# Patient Record
Sex: Female | Born: 2000 | Race: Black or African American | Hispanic: No | Marital: Single | Smoking: Never smoker
Health system: Southern US, Community
[De-identification: ages and names within clinical notes are randomized; demographics above are authoritative.]

## PROBLEM LIST (undated history)

## (undated) DIAGNOSIS — F419 Anxiety disorder, unspecified: Secondary | ICD-10-CM

## (undated) DIAGNOSIS — R7989 Other specified abnormal findings of blood chemistry: Secondary | ICD-10-CM

## (undated) DIAGNOSIS — Z8659 Personal history of other mental and behavioral disorders: Secondary | ICD-10-CM

## (undated) HISTORY — DX: Personal history of other mental and behavioral disorders: Z86.59

## (undated) HISTORY — DX: Other specified abnormal findings of blood chemistry: R79.89

## (undated) HISTORY — DX: Anxiety disorder, unspecified: F41.9

---
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
## 2019-01-01 ENCOUNTER — Other Ambulatory Visit: Payer: Self-pay

## 2019-01-01 ENCOUNTER — Ambulatory Visit: Payer: Medicaid Other | Admitting: "Endocrinology

## 2019-01-02 ENCOUNTER — Ambulatory Visit: Payer: Medicaid Other | Admitting: "Endocrinology

## 2019-02-04 ENCOUNTER — Ambulatory Visit (INDEPENDENT_AMBULATORY_CARE_PROVIDER_SITE_OTHER): Payer: Medicaid Other | Admitting: "Endocrinology

## 2019-02-04 ENCOUNTER — Other Ambulatory Visit: Payer: Self-pay

## 2019-02-04 ENCOUNTER — Encounter: Payer: Self-pay | Admitting: "Endocrinology

## 2019-02-04 VITALS — BP 136/88 | HR 109 | Ht 62.4 in | Wt 119.0 lb

## 2019-02-04 DIAGNOSIS — R7989 Other specified abnormal findings of blood chemistry: Secondary | ICD-10-CM | POA: Diagnosis not present

## 2019-02-04 DIAGNOSIS — E559 Vitamin D deficiency, unspecified: Secondary | ICD-10-CM | POA: Diagnosis not present

## 2019-02-04 NOTE — Progress Notes (Signed)
Endocrinology Consult Note                                            02/04/2019, 12:15 PM   Subjective:    Patient ID: Sandy Miller, female    DOB: Jul 19, 2001, PCP Fransisca Connors, MD   Past Medical History:  Diagnosis Date  . Anxiety   . Elevated TSH    Dr. [facility] Obtained Result of TSH 11 11/07/18 (results faxed to [facility])  . History of depression    History reviewed. No pertinent surgical history. Social History   Socioeconomic History  . Marital status: Single    Spouse name: Not on file  . Number of children: Not on file  . Years of education: Not on file  . Highest education level: Not on file  Occupational History  . Not on file  Social Needs  . Financial resource strain: Not on file  . Food insecurity:    Worry: Not on file    Inability: Not on file  . Transportation needs:    Medical: Not on file    Non-medical: Not on file  Tobacco Use  . Smoking status: Passive Smoke Exposure - Never Smoker  . Smokeless tobacco: Never Used  Substance and Sexual Activity  . Alcohol use: No  . Drug use: No  . Sexual activity: Never  Lifestyle  . Physical activity:    Days per week: Not on file    Minutes per session: Not on file  . Stress: Not on file  Relationships  . Social connections:    Talks on phone: Not on file    Gets together: Not on file    Attends religious service: Not on file    Active member of club or organization: Not on file    Attends meetings of clubs or organizations: Not on file    Relationship status: Not on file  Other Topics Concern  . Not on file  Social History Narrative   Lives with parents and younger brothers      No smokers      Graduated from high school    Family History  Problem Relation Age of Onset  . Other Father        Numbness of Unknown Etiology  . Mental illness Father   . Stomach cancer Maternal Grandfather        Died at 50  . Mental illness Mother        Bipolar  . Asthma  Brother   . Multiple sclerosis Paternal Aunt    Outpatient Encounter Medications as of 02/04/2019  Medication Sig  . D3-50 1.25 MG (50000 UT) capsule Take 50,000 Units by mouth once a week.  [name] 150-35 MCG/24HR transdermal patch APPLY 1 PATCH TO SKIN ONCE A WEEK AS DIRECTED  . [DISCONTINUED] fluconazole (DIFLUCAN) 150 MG tablet Take one tablet for one time (Patient not taking: Reported on 10/29/2017)  . [DISCONTINUED] naproxen (NAPROSYN) 500 MG tablet Take 1 tablet (500 mg total) by mouth 2 (two) times daily with a meal.   No facility-administered encounter medications on file as of 02/04/2019.    ALLERGIES: No Known Allergies  VACCINATION STATUS: Immunization History  Administered Date(s) Administered  . DTaP 04/26/2000, 07/03/2000, 10/02/2000, 05/21/2001, 01/19/2005  . HPV Quadrivalent 05/07/2013, 04/01/2014, 04/30/2015  . Hepatitis A 03/05/2006, 02/11/2008  .  Hepatitis B 11/13/1999, 10/02/2000, 02/25/2001  . HiB (PRP-OMP) 04/26/2000, 07/03/2000, 10/02/2000, 05/21/2001  . IPV 04/26/2000, 07/03/2000, 10/02/2000, 01/19/2005  . MMR 02/25/2001, 01/19/2005  . Meningococcal B Recombinant 05/02/2016, 09/13/2017  . Meningococcal Conjugate 04/01/2014, 05/02/2016  . Pneumococcal Conjugate-13 04/26/2000, 07/03/2000, 10/02/2000  . Tdap 04/24/2012  . Varicella 05/21/2001, 03/05/2006    HPI Sandy Miller is 18 y.o. female who presents today with a medical history as above. she is being seen in consultation for abnormal thyroid function test requested by Fransisca Connors, MD. -She denies any prior history of thyroid dysfunction.  She has distant cousins with various forms of thyroid dysfunction, however no family history of thyroid malignancy.  She was found to have TSH of 11.04 on November 15, 2018.  She denies weight gain, cold intolerance, -She denies palpitations, tremors, heat intolerance. -She denies dysphagia, shortness of breath, nor voice change.   Review of  Systems  Constitutional: no recent weight gain/loss, no fatigue, no subjective hyperthermia, no subjective hypothermia Eyes: no blurry vision, no xerophthalmia ENT: no sore throat, no nodules palpated in throat, no dysphagia/odynophagia, no hoarseness Cardiovascular: no Chest Pain, no Shortness of Breath, no palpitations, no leg swelling Respiratory: no cough, no shortness of breath Gastrointestinal: no Nausea/Vomiting/Diarhhea Musculoskeletal: no muscle/joint aches Skin: no rashes Neurological: no tremors, no numbness, no tingling, no dizziness Psychiatric: no depression, no anxiety  Objective:    BP 136/88   Pulse (!) 109   Ht 5' 2.4" (1.585 m)   Wt 119 lb (54 kg)   BMI 21.49 kg/m   Wt Readings from Last 3 Encounters:  02/04/19 119 lb (54 kg) (35 %, Z= -0.38)*  10/17/18 112 lb 6.4 oz (51 kg) (23 %, Z= -0.75)*  10/11/18 116 lb (52.6 kg) (30 %, Z= -0.52)*   * Growth percentiles are based on CDC (Girls, 2-20 Years) data.    Physical Exam  Constitutional:  + Appropriate weight for height, not in acute distress, normal state of mind Eyes: PERRLA, EOMI, no exophthalmos ENT: moist mucous membranes, no gross thyromegaly, no gross cervical lymphadenopathy Cardiovascular: normal precordial activity, Regular Rate and Rhythm, no Murmur/Rubs/Gallops Respiratory:  adequate breathing efforts, no gross chest deformity, Clear to auscultation bilaterally Gastrointestinal: abdomen soft, Non -tender, No distension, Bowel Sounds present, no gross organomegaly Musculoskeletal: no gross deformities, strength intact in all four extremities Skin: moist, warm, no rashes Neurological: + Mild tremor with outstretched hands, Deep tendon reflexes normal in bilateral lower extremities.  CMP ( most recent) CMP     Component Value Date/Time   NA 135 10/10/2013 1239   K 4.1 10/10/2013 1239   CL 99 10/10/2013 1239   CO2 25 10/10/2013 1239   GLUCOSE 78 10/10/2013 1239   BUN 9 10/10/2013 1239    CREATININE 0.87 10/10/2013 1239   CALCIUM 9.8 10/10/2013 1239     Diabetic Labs (most recent): Lab Results  Component Value Date   HGBA1C 5.3 11/07/2018   HGBA1C 5.7 (H) 10/10/2013     Lab Results  Component Value Date   TSH 11.04 (A) 11/07/2018      Assessment & Plan:   1. Abnormal thyroid blood test 2.  Vitamin D deficiency - Quadasia T Badeaux  is being seen at a kind request of Fransisca Connors, MD. - I have reviewed her available endocrine records and clinically evaluated the patient. - Based on these reviews, she has elevated TSH of 11.04 which may indicate hypothyroidism,  however,  there is not sufficient information to proceed  with definitive treatment plan.  - she will need a repeat,  more complete thyroid function tests towards confirming the diagnosis, including TSH, free T4/free T3, and antithyroid antibodies. -She does not have clinical goiter, hence no need for thyroid imaging.  Her A1c is 5.3% indicating absence of prediabetes/diabetes.  - I did not initiate any new prescriptions today.  -She is advised to continue on her vitamin D 50,000 units weekly for total of 12 weeks. - I advised her  to maintain close follow up with Fransisca Connors, MD for primary care needs.   - Time spent with the patient: 45 minutes, of which >50% was spent in obtaining information about her symptoms, reviewing her previous labs/studies,  evaluations, and treatments, counseling her about her abnormal thyroid function tests, and developing a plan to confirm the diagnosis and long term treatment based on the latest standards of care/guidelines.    Mixtli T Lempke participated in the discussions, expressed understanding, and voiced agreement with the above plans.  All questions were answered to her satisfaction. she is encouraged to contact clinic should she have any questions or concerns prior to her return visit.  Follow up plan: Return in about 1 week (around 02/11/2019)  for Labs Today- Non-Fasting Ok.   Glade Lloyd, MD [facility] [address] [place], [place] [address] Phone: [phone]  Fax: [phone]     02/04/2019, 12:15 PM  This note was partially dictated with voice recognition software. Similar sounding words can be transcribed inadequately or may not  be corrected upon review.

## 2019-02-05 LAB — THYROID PEROXIDASE ANTIBODY: Thyroperoxidase Ab SerPl-aCnc: <1 IU/mL (ref <9)

## 2019-02-05 LAB — T3, FREE: T3, Free: 2.9 pg/mL — ABNORMAL LOW (ref 3.0–4.7)

## 2019-02-05 LAB — THYROGLOBULIN ANTIBODY: Thyroglobulin Ab: <1 IU/mL (ref <=1)

## 2019-02-05 LAB — TSH: TSH: 4.72 mIU/L — ABNORMAL HIGH

## 2019-02-05 LAB — T4, FREE: Free T4: 1.1 ng/dL (ref 0.8–1.4)

## 2019-02-11 ENCOUNTER — Encounter: Payer: Self-pay | Admitting: "Endocrinology

## 2019-02-11 ENCOUNTER — Ambulatory Visit (INDEPENDENT_AMBULATORY_CARE_PROVIDER_SITE_OTHER): Payer: Medicaid Other | Admitting: "Endocrinology

## 2019-02-11 ENCOUNTER — Other Ambulatory Visit: Payer: Self-pay

## 2019-02-11 VITALS — BP 147/73 | HR 79 | Ht 62.4 in | Wt 117.0 lb

## 2019-02-11 DIAGNOSIS — E039 Hypothyroidism, unspecified: Secondary | ICD-10-CM

## 2019-02-11 MED ORDER — LEVOTHYROXINE SODIUM 25 MCG PO TABS: 25.0000 ug | ORAL_TABLET | Freq: Every day | ORAL | 3 refills | Status: DC

## 2019-02-11 NOTE — Progress Notes (Signed)
Endocrinology follow-up note                                            02/11/2019, 1:43 PM   Subjective:    Patient ID: Sandy Miller, female    DOB: 2001-01-14, PCP Fransisca Connors, MD   Past Medical History:  Diagnosis Date  . Anxiety   . Elevated TSH    [facility] Obtained Result of TSH 11 11/07/18 (results faxed to [facility])  . History of depression    History reviewed. No pertinent surgical history. Social History   Socioeconomic History  . Marital status: Single    Spouse name: Not on file  . Number of children: Not on file  . Years of education: Not on file  . Highest education level: Not on file  Occupational History  . Not on file  Social Needs  . Financial resource strain: Not on file  . Food insecurity:    Worry: Not on file    Inability: Not on file  . Transportation needs:    Medical: Not on file    Non-medical: Not on file  Tobacco Use  . Smoking status: Passive Smoke Exposure - Never Smoker  . Smokeless tobacco: Never Used  Substance and Sexual Activity  . Alcohol use: No  . Drug use: No  . Sexual activity: Never  Lifestyle  . Physical activity:    Days per week: Not on file    Minutes per session: Not on file  . Stress: Not on file  Relationships  . Social connections:    Talks on phone: Not on file    Gets together: Not on file    Attends religious service: Not on file    Active member of club or organization: Not on file    Attends meetings of clubs or organizations: Not on file    Relationship status: Not on file  Other Topics Concern  . Not on file  Social History Narrative   Lives with parents and younger brothers      No smokers      Graduated from high school    Family History  Problem Relation Age of Onset  . Other Father        Numbness of Unknown Etiology  . Mental illness Father   . Stomach cancer Maternal Grandfather        Died at 9  . Mental illness Mother        Bipolar  . Asthma  Brother   . Multiple sclerosis Paternal Aunt    Outpatient Encounter Medications as of 02/11/2019  Medication Sig  . D3-50 1.25 MG (50000 UT) capsule Take 50,000 Units by mouth once a week.  . levothyroxine (SYNTHROID) 25 MCG tablet Take 1 tablet (25 mcg total) by mouth daily before breakfast.  . [name] 150-35 MCG/24HR transdermal patch APPLY 1 PATCH TO SKIN ONCE A WEEK AS DIRECTED   No facility-administered encounter medications on file as of 02/11/2019.    ALLERGIES: No Known Allergies  VACCINATION STATUS: Immunization History  Administered Date(s) Administered  . DTaP 04/26/2000, 07/03/2000, 10/02/2000, 05/21/2001, 01/19/2005  . HPV Quadrivalent 05/07/2013, 04/01/2014, 04/30/2015  . Hepatitis A 03/05/2006, 02/11/2008  . Hepatitis B Jul 02, 2000, 10/02/2000, 02/25/2001  . HiB (PRP-OMP) 04/26/2000, 07/03/2000, 10/02/2000, 05/21/2001  . IPV 04/26/2000, 07/03/2000, 10/02/2000, 01/19/2005  . MMR  02/25/2001, 01/19/2005  . Meningococcal B Recombinant 05/02/2016, 09/13/2017  . Meningococcal Conjugate 04/01/2014, 05/02/2016  . Pneumococcal Conjugate-13 04/26/2000, 07/03/2000, 10/02/2000  . Tdap 04/24/2012  . Varicella 05/21/2001, 03/05/2006    HPI Sandy Miller is 18 y.o. female who presents today with repeat thyroid function test after she was seen in consultation for abnormally high TSH.  Her previsit labs confirm hypothyroidism.  She is not on any thyroid hormone replacement at this time.  She does not have any new complaints today.  -She denies any prior history of thyroid dysfunction.  She has distant cousins with various forms of thyroid dysfunction, however no family history of thyroid malignancy.     She denies weight gain, cold intolerance, she reports poor appetite and fatigue. -She denies palpitations, tremors, heat intolerance. -She denies dysphagia, shortness of breath, nor voice change.   Review of Systems  Constitutional: + Steady weight, + fatigue,  no subjective  hyperthermia, no subjective hypothermia Eyes: no blurry vision, no xerophthalmia ENT: no sore throat, no nodules palpated in throat, no dysphagia/odynophagia, no hoarseness Cardiovascular: no Chest Pain, no Shortness of Breath, no palpitations, no leg swelling Respiratory: no cough, no shortness of breath Gastrointestinal: no Nausea/Vomiting/Diarhhea Musculoskeletal: no muscle/joint aches Skin: no rashes Neurological: no tremors, no numbness, no tingling, no dizziness Psychiatric: no depression, no anxiety  Objective:    BP (!) 147/73   Pulse 79   Ht 5' 2.4" (1.585 m)   Wt 117 lb (53.1 kg)   BMI 21.13 kg/m   Wt Readings from Last 3 Encounters:  02/11/19 117 lb (53.1 kg) (31 %, Z= -0.50)*  02/04/19 119 lb (54 kg) (35 %, Z= -0.38)*  10/17/18 112 lb 6.4 oz (51 kg) (23 %, Z= -0.75)*   * Growth percentiles are based on CDC (Girls, 2-20 Years) data.    Physical Exam Constitutional:  not in acute distress, normal state of mind Eyes:  EOMI, no exophthalmos Neck: Supple Respiratory: Adequate breathing efforts Musculoskeletal: no gross deformities, strength intact in all four extremities Skin:  no rashes, no hyperemia Neurological: no tremor with outstretched hands.   CMP ( most recent) CMP     Component Value Date/Time   NA 135 10/10/2013 1239   K 4.1 10/10/2013 1239   CL 99 10/10/2013 1239   CO2 25 10/10/2013 1239   GLUCOSE 78 10/10/2013 1239   BUN 9 10/10/2013 1239   CREATININE 0.87 10/10/2013 1239   CALCIUM 9.8 10/10/2013 1239     Diabetic Labs (most recent): Lab Results  Component Value Date   HGBA1C 5.3 11/07/2018   HGBA1C 5.7 (H) 10/10/2013     Lab Results  Component Value Date   TSH 4.72 (H) 02/04/2019   TSH 11.04 (A) 11/07/2018   FREET4 1.1 02/04/2019      Assessment & Plan:   1. Abnormal thyroid blood test 2.  Vitamin D deficiency -Her previsit labs confirm primary hypothyroidism.  She would benefit from early initiation of thyroid hormone  supplement.  I discussed and initiated levothyroxine 25 mcg p.o. every morning.   - We discussed about the correct intake of her thyroid hormone, on empty stomach at fasting, with water, separated by at least 30 minutes from breakfast and other medications,  and separated by more than 4 hours from calcium, iron, multivitamins, acid reflux medications (PPIs). -Patient is made aware of the fact that thyroid hormone replacement is needed for life, dose to be adjusted by periodic monitoring of thyroid function tests.   -She does  not have clinical goiter, hence no need for thyroid imaging.  Her A1c is 5.3% indicating absence of prediabetes/diabetes.   -She is advised to continue on her vitamin D 50,000 units weekly for total of 12 weeks. - I advised her  to maintain close follow up with Fransisca Connors, MD for primary care needs.   Follow up plan: Return in about 3 months (around 05/14/2019) for Follow up with Pre-visit Labs.   Glade Lloyd, MD [facility] [address] [place], [place] [address] Phone: [phone]  Fax: [phone]     02/11/2019, 1:43 PM  This note was partially dictated with voice recognition software. Similar sounding words can be transcribed inadequately or may not  be corrected upon review.

## 2019-02-27 ENCOUNTER — Other Ambulatory Visit: Payer: Self-pay | Admitting: Pediatrics

## 2019-04-07 DIAGNOSIS — Z3045 Encounter for surveillance of transdermal patch hormonal contraceptive device: Secondary | ICD-10-CM | POA: Insufficient documentation

## 2019-05-08 LAB — TSH: TSH: 4.58 mIU/L — ABNORMAL HIGH

## 2019-05-08 LAB — T4, FREE: Free T4: 1.3 ng/dL (ref 0.8–1.4)

## 2019-05-14 ENCOUNTER — Ambulatory Visit (INDEPENDENT_AMBULATORY_CARE_PROVIDER_SITE_OTHER): Payer: BLUE CROSS/BLUE SHIELD | Admitting: "Endocrinology

## 2019-05-14 ENCOUNTER — Other Ambulatory Visit: Payer: Self-pay

## 2019-05-14 ENCOUNTER — Encounter: Payer: Self-pay | Admitting: "Endocrinology

## 2019-05-14 DIAGNOSIS — E039 Hypothyroidism, unspecified: Secondary | ICD-10-CM | POA: Diagnosis not present

## 2019-05-14 MED ORDER — LEVOTHYROXINE SODIUM 25 MCG PO TABS: 37.5000 ug | ORAL_TABLET | Freq: Every day | ORAL | 3 refills | Status: DC

## 2019-05-14 NOTE — Progress Notes (Signed)
05/14/2019, 1:00 PM                                Endocrinology Telehealth Visit Follow up Note -During COVID -19 Pandemic  I connected with Carroll T Suto on 05/14/2019   by telephone and verified that I am speaking with the correct person using two identifiers. Sandy Miller, December 30, 2000. she has verbally consented to this visit. All issues noted in this document were discussed and addressed. The format was not optimal for physical exam.   Subjective:    Patient ID: Sandy Miller, female    DOB: [birth year], PCP Fransisca Connors, MD   Past Medical History:  Diagnosis Date  . Anxiety   . Elevated TSH    [facility] Obtained Result of TSH 11 11/07/18 (results faxed to [facility])  . History of depression    History reviewed. No pertinent surgical history. Social History   Socioeconomic History  . Marital status: Single    Spouse name: Not on file  . Number of children: Not on file  . Years of education: Not on file  . Highest education level: Not on file  Occupational History  . Not on file  Social Needs  . Financial resource strain: Not on file  . Food insecurity    Worry: Not on file    Inability: Not on file  . Transportation needs    Medical: Not on file    Non-medical: Not on file  Tobacco Use  . Smoking status: Passive Smoke Exposure - Never Smoker  . Smokeless tobacco: Never Used  Substance and Sexual Activity  . Alcohol use: No  . Drug use: No  . Sexual activity: Never  Lifestyle  . Physical activity    Days per week: Not on file    Minutes per session: Not on file  . Stress: Not on file  Relationships  . Social Herbalist on phone: Not on file    Gets together: Not on file    Attends religious service: Not on file    Active member of club or organization: Not on file    Attends meetings of clubs or organizations: Not on file    Relationship status: Not on file   Other Topics Concern  . Not on file  Social History Narrative   Lives with parents and younger brothers      No smokers      Graduated from high school    Family History  Problem Relation Age of Onset  . Other Father        Numbness of Unknown Etiology  . Mental illness Father   . Stomach cancer Maternal Grandfather        Died at 8  . Mental illness Mother        Bipolar  . Asthma Brother   . Multiple sclerosis Paternal Aunt    Outpatient Encounter Medications as of 05/14/2019  Medication Sig  . D3-50 1.25 MG (50000 UT) capsule Take 50,000 Units by mouth once a week.  . levothyroxine (SYNTHROID) 25 MCG tablet Take 1.5 tablets (37.5 mcg total) by mouth  daily before breakfast.  . [name] 150-35 MCG/24HR transdermal patch APPLY 1 PATCH TO SKIN ONCE A WEEK AS DIRECTED  . [DISCONTINUED] levothyroxine (SYNTHROID) 25 MCG tablet Take 1 tablet (25 mcg total) by mouth daily before breakfast.   No facility-administered encounter medications on file as of 05/14/2019.    ALLERGIES: No Known Allergies  VACCINATION STATUS: Immunization History  Administered Date(s) Administered  . DTaP 04/26/2000, 07/03/2000, 10/02/2000, 05/21/2001, 01/19/2005  . HPV Quadrivalent 05/07/2013, 04/01/2014, 04/30/2015  . Hepatitis A 03/05/2006, 02/11/2008  . Hepatitis B 03-01-2000, 10/02/2000, 02/25/2001  . HiB (PRP-OMP) 04/26/2000, 07/03/2000, 10/02/2000, 05/21/2001  . IPV 04/26/2000, 07/03/2000, 10/02/2000, 01/19/2005  . MMR 02/25/2001, 01/19/2005  . Meningococcal B Recombinant 05/02/2016, 09/13/2017  . Meningococcal Conjugate 04/01/2014, 05/02/2016  . Pneumococcal Conjugate-13 04/26/2000, 07/03/2000, 10/02/2000  . Tdap 04/24/2012  . Varicella 05/21/2001, 03/05/2006    HPI Sandy Miller is 18 y.o. female who is being engaged in telehealth via telephone for follow-up of hypothyroidism.  She was initiated on levothyroxine 25 mcg p.o. daily in the morning during her last visit.   -She  continues to feel better including with better energy.  However, she admits to some inconsistency in taking her thyroid hormone. She does not have any new complaints today.  - She has distant cousins with various forms of thyroid dysfunction, however no family history of thyroid malignancy.   -She denies palpitations, tremors, heat intolerance. -She denies dysphagia, shortness of breath, nor voice change.   Review of Systems Limited as above.  Objective:    There were no vitals taken for this visit.  Wt Readings from Last 3 Encounters:  02/11/19 117 lb (53.1 kg) (31 %, Z= -0.50)*  02/04/19 119 lb (54 kg) (35 %, Z= -0.38)*  10/17/18 112 lb 6.4 oz (51 kg) (23 %, Z= -0.75)*   * Growth percentiles are based on CDC (Girls, 2-20 Years) data.    CMP ( most recent) CMP     Component Value Date/Time   NA 135 10/10/2013 1239   K 4.1 10/10/2013 1239   CL 99 10/10/2013 1239   CO2 25 10/10/2013 1239   GLUCOSE 78 10/10/2013 1239   BUN 9 10/10/2013 1239   CREATININE 0.87 10/10/2013 1239   CALCIUM 9.8 10/10/2013 1239     Diabetic Labs (most recent): Lab Results  Component Value Date   HGBA1C 5.3 11/07/2018   HGBA1C 5.7 (H) 10/10/2013     Lab Results  Component Value Date   TSH 4.58 (H) 05/08/2019   TSH 4.72 (H) 02/04/2019   TSH 11.04 (A) 11/07/2018   FREET4 1.3 05/08/2019   FREET4 1.1 02/04/2019      Assessment & Plan:   1.  Hypothyroidism. 2.  Vitamin D deficiency -Based on her previsit thyroid function test, she would benefit from slight increase in her levothyroxine.  I discussed and increased her levothyroxine to 37.5 mcg p.o. daily before breakfast.  - We discussed about the correct intake of her thyroid hormone, on empty stomach at fasting, with water, separated by at least 30 minutes from breakfast and other medications,  and separated by more than 4 hours from calcium, iron, multivitamins, acid reflux medications (PPIs). -Patient is made aware of the fact that thyroid  hormone replacement is needed for life, dose to be adjusted by periodic monitoring of thyroid function tests.  She is status post vitamin D supplement.  She will have repeat vitamin D measurement   Before her next visit.  - I advised her  to  maintain close follow up with Fransisca Connors, MD for primary care needs.   Time for this visit: 15 minutes. Everlie T Marcella  participated in the discussions, expressed understanding, and voiced agreement with the above plans.  All questions were answered to her satisfaction. she is encouraged to contact clinic should she have any questions or concerns prior to her return visit.  Follow up plan: Return in about 4 months (around 09/13/2019) for Follow up with Pre-visit Labs.   Glade Lloyd, MD [facility] [address] [place], [place] [address] Phone: [phone]  Fax: [phone]     05/14/2019, 1:00 PM  This note was partially dictated with voice recognition software. Similar sounding words can be transcribed inadequately or may not  be corrected upon review.

## 2019-09-02 LAB — VITAMIN D 25 HYDROXY (VIT D DEFICIENCY, FRACTURES): Vit D, 25-Hydroxy: 9 ng/mL — ABNORMAL LOW (ref 30–100)

## 2019-09-02 LAB — TSH: TSH: 3.69 mIU/L

## 2019-09-02 LAB — T4, FREE: Free T4: 1.2 ng/dL (ref 0.8–1.4)

## 2019-09-16 ENCOUNTER — Encounter: Payer: Self-pay | Admitting: "Endocrinology

## 2019-09-16 ENCOUNTER — Ambulatory Visit (INDEPENDENT_AMBULATORY_CARE_PROVIDER_SITE_OTHER): Payer: BLUE CROSS/BLUE SHIELD | Admitting: "Endocrinology

## 2019-09-16 DIAGNOSIS — E039 Hypothyroidism, unspecified: Secondary | ICD-10-CM

## 2019-09-16 DIAGNOSIS — E559 Vitamin D deficiency, unspecified: Secondary | ICD-10-CM

## 2019-09-16 MED ORDER — LEVOTHYROXINE SODIUM 50 MCG PO TABS: 50.0000 ug | ORAL_TABLET | Freq: Every day | ORAL | 6 refills | Status: DC

## 2019-09-16 MED ORDER — D3-50 1.25 MG (50000 UT) PO CAPS: 50000.0000 [IU] | ORAL_CAPSULE | ORAL | 1 refills | Status: DC

## 2019-09-16 NOTE — Progress Notes (Signed)
09/16/2019, 1:04 PM                                Endocrinology Telehealth Visit Follow up Note -During COVID -19 Pandemic  I connected with Sandy Miller on 09/16/2019   by telephone and verified that I am speaking with the correct person using two identifiers. Sandy Miller, [birth year]. she has verbally consented to this visit. All issues noted in this document were discussed and addressed. The format was not optimal for physical exam.   Subjective:    Patient ID: Sandy Miller, female    DOB: 2001-04-24, PCP Fransisca Connors, MD   Past Medical History:  Diagnosis Date  . Anxiety   . Elevated TSH    [facility] Obtained Result of TSH 11 11/07/18 (results faxed to [facility])  . History of depression    History reviewed. No pertinent surgical history. Social History   Socioeconomic History  . Marital status: Single    Spouse name: Not on file  . Number of children: Not on file  . Years of education: Not on file  . Highest education level: Not on file  Occupational History  . Not on file  Tobacco Use  . Smoking status: Passive Smoke Exposure - Never Smoker  . Smokeless tobacco: Never Used  Substance and Sexual Activity  . Alcohol use: No  . Drug use: No  . Sexual activity: Never  Other Topics Concern  . Not on file  Social History Narrative   Lives with parents and younger brothers      No smokers      Graduated from high school    Social Determinants of Health   Financial Resource Strain:   . Difficulty of Paying Living Expenses: Not on file  Food Insecurity:   . Worried About Charity fundraiser in the Last Year: Not on file  . Ran Out of Food in the Last Year: Not on file  Transportation Needs:   . Lack of Transportation (Medical): Not on file  . Lack of Transportation (Non-Medical): Not on file  Physical Activity:   . Days of Exercise per Week: Not on file  . Minutes of  Exercise per Session: Not on file  Stress:   . Feeling of Stress : Not on file  Social Connections:   . Frequency of Communication with Friends and Family: Not on file  . Frequency of Social Gatherings with Friends and Family: Not on file  . Attends Religious Services: Not on file  . Active Member of Clubs or Organizations: Not on file  . Attends Archivist Meetings: Not on file  . Marital Status: Not on file   Family History  Problem Relation Age of Onset  . Other Father        Numbness of Unknown Etiology  . Mental illness Father   . Stomach cancer Maternal Grandfather        Died at 5  . Mental illness Mother        Bipolar  . Asthma Brother   . Multiple sclerosis Paternal Aunt    Outpatient Encounter Medications as  of 09/16/2019  Medication Sig  . D3-50 1.25 MG (50000 UT) capsule Take 1 capsule (50,000 Units total) by mouth once a week.  . levothyroxine (SYNTHROID) 50 MCG tablet Take 1 tablet (50 mcg total) by mouth daily before breakfast.  . [name] 150-35 MCG/24HR transdermal patch APPLY 1 PATCH TO SKIN ONCE A WEEK AS DIRECTED  . [DISCONTINUED] D3-50 1.25 MG (50000 UT) capsule Take 50,000 Units by mouth once a week.  . [DISCONTINUED] levothyroxine (SYNTHROID) 25 MCG tablet Take 1.5 tablets (37.5 mcg total) by mouth daily before breakfast.   No facility-administered encounter medications on file as of 09/16/2019.   ALLERGIES: No Known Allergies  VACCINATION STATUS: Immunization History  Administered Date(s) Administered  . DTaP 04/26/2000, 07/03/2000, 10/02/2000, 05/21/2001, 01/19/2005  . HPV Quadrivalent 05/07/2013, 04/01/2014, 04/30/2015  . Hepatitis A 03/05/2006, 02/11/2008  . Hepatitis B March 21, 2000, 10/02/2000, 02/25/2001  . HiB (PRP-OMP) 04/26/2000, 07/03/2000, 10/02/2000, 05/21/2001  . IPV 04/26/2000, 07/03/2000, 10/02/2000, 01/19/2005  . MMR 02/25/2001, 01/19/2005  . Meningococcal B Recombinant 05/02/2016, 09/13/2017  . Meningococcal Conjugate  04/01/2014, 05/02/2016  . Pneumococcal Conjugate-13 04/26/2000, 07/03/2000, 10/02/2000  . Tdap 04/24/2012  . Varicella 05/21/2001, 03/05/2006    HPI Sandy Miller is 18 y.o. female who is being engaged in telehealth via telephone for follow-up of hypothyroidism.  She was initiated on levothyroxine 37.5  mcg p.o. daily in the morning during her last visit.   -She continues to feel better including with better energy. She does not have any new complaints today.  -For unclear reasons, she stopped taking her vitamin D.  Her previsit labs show profound vitamin D deficiency - She has distant cousins with various forms of thyroid dysfunction, however no family history of thyroid malignancy.   -She denies palpitations, tremors, heat intolerance. -She denies dysphagia, shortness of breath, nor voice change.   Review of Systems Limited as above.  Objective:    There were no vitals taken for this visit.  Wt Readings from Last 3 Encounters:  02/11/19 117 lb (53.1 kg) (31 %, Z= -0.50)*  02/04/19 119 lb (54 kg) (35 %, Z= -0.38)*  10/17/18 112 lb 6.4 oz (51 kg) (23 %, Z= -0.75)*   * Growth percentiles are based on CDC (Girls, 2-20 Years) data.    CMP ( most recent) CMP     Component Value Date/Time   NA 135 10/10/2013 1239   K 4.1 10/10/2013 1239   CL 99 10/10/2013 1239   CO2 25 10/10/2013 1239   GLUCOSE 78 10/10/2013 1239   BUN 9 10/10/2013 1239   CREATININE 0.87 10/10/2013 1239   CALCIUM 9.8 10/10/2013 1239     Diabetic Labs (most recent): Lab Results  Component Value Date   HGBA1C 5.3 11/07/2018   HGBA1C 5.7 (H) 10/10/2013     Lab Results  Component Value Date   TSH 3.69 09/02/2019   TSH 4.58 (H) 05/08/2019   TSH 4.72 (H) 02/04/2019   TSH 11.04 (A) 11/07/2018   FREET4 1.2 09/02/2019   FREET4 1.3 05/08/2019   FREET4 1.1 02/04/2019      Assessment & Plan:   1.  Hypothyroidism. 2.  Vitamin D deficiency -Based on her previsit thyroid function test, she will  benefit from slight increase in her levothyroxine.  I discussed and increase her levothyroxine to 50 mcg p.o. daily before breakfast.      - We discussed about the correct intake of her thyroid hormone, on empty stomach at fasting, with water, separated by at least 30 minutes  from breakfast and other medications,  and separated by more than 4 hours from calcium, iron, multivitamins, acid reflux medications (PPIs). -Patient is made aware of the fact that thyroid hormone replacement is needed for life, dose to be adjusted by periodic monitoring of thyroid function tests.   She is advised to resume her vitamin D supplement with Elocon for 50,000 units weekly x24 weeks.     - I advised her  to maintain close follow up with Fransisca Connors, MD for primary care needs.   Time for this visit: 15 minutes. Sandy Miller  participated in the discussions, expressed understanding, and voiced agreement with the above plans.  All questions were answered to her satisfaction. she is encouraged to contact clinic should she have any questions or concerns prior to her return visit.   Follow up plan: Return in about 4 months (around 01/15/2020) for Follow up with Pre-visit Labs.   Glade Lloyd, MD [facility] [address] [place], [place] [address] Phone: [phone]  Fax: [phone]     09/16/2019, 1:04 PM  This note was partially dictated with voice recognition software. Similar sounding words can be transcribed inadequately or may not  be corrected upon review.

## 2019-10-20 ENCOUNTER — Other Ambulatory Visit: Payer: Self-pay | Admitting: "Endocrinology

## 2019-10-21 ENCOUNTER — Ambulatory Visit (INDEPENDENT_AMBULATORY_CARE_PROVIDER_SITE_OTHER): Payer: Self-pay | Admitting: Licensed Clinical Social Worker

## 2019-10-21 ENCOUNTER — Ambulatory Visit (INDEPENDENT_AMBULATORY_CARE_PROVIDER_SITE_OTHER): Payer: Medicaid Other | Admitting: Pediatrics

## 2019-10-21 ENCOUNTER — Encounter: Payer: Self-pay | Admitting: Pediatrics

## 2019-10-21 ENCOUNTER — Other Ambulatory Visit: Payer: Self-pay

## 2019-10-21 VITALS — BP 116/80 | Ht 62.0 in | Wt 119.6 lb

## 2019-10-21 DIAGNOSIS — Z0000 Encounter for general adult medical examination without abnormal findings: Secondary | ICD-10-CM

## 2019-10-21 DIAGNOSIS — Z00129 Encounter for routine child health examination without abnormal findings: Secondary | ICD-10-CM

## 2019-10-21 DIAGNOSIS — Z68.41 Body mass index (BMI) pediatric, 85th percentile to less than 95th percentile for age: Secondary | ICD-10-CM | POA: Diagnosis not present

## 2019-10-21 NOTE — Progress Notes (Signed)
Adolescent Well Care Visit Sandy Miller is a 19 y.o. female who is here for well care.    PCP:  Rosiland Oz, MD   History was provided by the patient.  Confidentiality was discussed with the patient and, if applicable, with caregiver as well.  Current Issues: Current concerns include: none, receiving care for hypothyroidism with Endocrinology. Patient states that she is taking her Synthroid as prescribed, but, has restarted her Vit D supplement.   Nutrition: Nutrition/Eating Behaviors:  Tries to eat variety of fruits and veggies  Adequate calcium in diet?:  No , does not drink milk  Supplements/ Vitamins:  Vit D   Exercise/ Media: Play any Sports?/ Exercise: no  Screen Time:  > 2 hours-counseling provided  Social Screening: Lives with:  Mother  Parental relations:  good Activities, Work, and Regulatory affairs officer?: no Concerns regarding behavior with peers?  no Stressors of note: no  Menstruation:   No LMP recorded. Menstrual History:  Monthly, no problems with heavy bleeding or cramping    Confidential Social History: Tobacco?  no Secondhand smoke exposure?  no Drugs/ETOH?  no  Sexually Active?  no   Pregnancy Prevention: abstinence, Xulane patch   Safe at home, in school & in relationships?  Yes Safe to self?  Yes   Screenings: Patient has a dental home: yes  PHQ-9 completed and results indicated 0  Physical Exam:  Vitals:   10/21/19 1012 10/21/19 1140  BP: 132/80 116/80  Weight: 119 lb 9.6 oz (54.3 kg)   Height: 5\' 2"  (1.575 m)    BP 116/80   Ht 5\' 2"  (1.575 m)   Wt 119 lb 9.6 oz (54.3 kg)   BMI 21.88 kg/m  Body mass index: body mass index is 21.88 kg/m. Blood pressure percentiles are not available for patients who are 18 years or older.   Hearing Screening   125Hz  250Hz  500Hz  1000Hz  2000Hz  3000Hz  4000Hz  6000Hz  8000Hz   Right ear:   30 20 20 20 20     Left ear:   25 20 20 20 20       Visual Acuity Screening   Right eye Left eye Both eyes   Without correction: 20/30 20/25   With correction:       General Appearance:   alert, oriented, no acute distress  HENT: Normocephalic, no obvious abnormality, conjunctiva clear  Mouth:   Normal appearing teeth, no obvious discoloration, dental caries, or dental caps  Neck:   Supple; thyroid: no enlargement, symmetric, no tenderness/mass/nodules  Chest Normal   Lungs:   Clear to auscultation bilaterally, normal work of breathing  Heart:   Regular rate and rhythm, S1 and S2 normal, no murmurs;   Abdomen:   Soft, non-tender, no mass, or organomegaly  GU genitalia not examined  Musculoskeletal:   Tone and strength strong and symmetrical, all extremities               Lymphatic:   No cervical adenopathy  Skin/Hair/Nails:   Skin warm, dry and intact, no rashes, no bruises or petechiae  Neurologic:   Strength, gait, and coordination normal and age-appropriate     Assessment and Plan:   .1. Encounter for routine child health examination without abnormal findings - GC/Chlamydia Probe Amp([facility])  2. Body mass index, pediatric, 85th percentile to less than 95th percentile for age  Continue with routine follow up with Dr. , Endocrinology    BMI is appropriate for age  Hearing screening result:normal Vision screening result: normal  Counseling provided for  all of the vaccine components  Orders Placed This Encounter  Procedures  . GC/Chlamydia Probe Amp([facility])      Return in 1 year (on 10/20/2020).Fransisca Connors, MD

## 2019-10-21 NOTE — BH Specialist Note (Signed)
Integrated Behavioral Health Follow Up Visit  MRN: [id number] Name: Sandy Miller  Number of Integrated Behavioral Health Clinician visits: 1/6 Session Start time: 10:20am  Session End time: 10:30am Total time: 10 mins  Type of Service: Integrated Behavioral Health- Family Interpretor:No.   SUBJECTIVE: Sandy Miller is a 19 y.o. female who attended her appointment alone.  Patient was referred by Dr. Meredeth Ide to review PHQ. Patient reports the following symptoms/concerns: Patient has a history of Depression,  Patient reports some recent difficulty sleeping, some and changes in energy and appetite.  Duration of problem: several months; Severity of problem: mild  OBJECTIVE: Mood: Depressed and Affect: Blunt Risk of harm to self or others: No plan to harm self or others  LIFE CONTEXT: Family and Social: Patient lives with her Mom and two brothers (15 and 44).  School/Work: Patient reports that she is currently unemployed due to her health issues.  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  Self-Care: Patient reports that she has had a history of depression and counseling but currently does not feel like she needs counseling.  Life Changes: Patient recently has been working with Dr. Fransico Him to address thyroid issues and deficiencies.   GOALS ADDRESSED: Patient will: 1.  Reduce symptoms of: depression and stress  2.  Increase knowledge and/or ability of: coping skills and healthy habits  3.  Demonstrate ability to: Increase healthy adjustment to current life circumstances  INTERVENTIONS: Interventions utilized:  Supportive Counseling and Psychoeducation and/or Health Education Standardized Assessments completed: PHQ 9 Modified for Teens-score of 6.   ASSESSMENT: Patient currently experiencing depressive symptoms but reports she feels like they are manageable for now.  Patient reports she is having some  "intrusive thoughts" but when asked said she did not want to talk about them today.  Clinician screed the Patient for SI/HI which the Patient did not report.  The Clinician reviewed with the Patient benefits of therapy and options regarding providers in the area (including our clinic). Patient declined engagement with therapy for now but states she will call if she feels like she wants to start back.   Patient may benefit from continued follow up as needed  PLAN: 1. Follow up with behavioral health clinician as needed 2. Behavioral recommendations: return as needed 3. Referral(s): Integrated [employer] (In Clinic)  Katheran Awe, [facility]

## 2019-10-22 LAB — GC/CHLAMYDIA PROBE AMP
Chlamydia trachomatis, NAA: NEGATIVE
Neisseria Gonorrhoeae by PCR: NEGATIVE

## 2019-10-29 ENCOUNTER — Other Ambulatory Visit: Payer: Self-pay | Admitting: "Endocrinology

## 2019-11-04 ENCOUNTER — Telehealth: Payer: Self-pay | Admitting: "Endocrinology

## 2019-11-04 MED ORDER — LEVOTHYROXINE SODIUM 50 MCG PO TABS: ORAL_TABLET | ORAL | 1 refills | Status: DC

## 2019-11-04 NOTE — Telephone Encounter (Signed)
Rx for levothyroxine sent to [facility]

## 2019-11-04 NOTE — Telephone Encounter (Signed)
Pt said [facility] does not have her RX for levothyroxine (SYNTHROID) 50 MCG tablet. Can you re send it? It was sent on 2/10

## 2019-12-02 ENCOUNTER — Telehealth: Payer: Self-pay | Admitting: Advanced Practice Midwife

## 2019-12-02 NOTE — Telephone Encounter (Signed)

## 2019-12-03 ENCOUNTER — Ambulatory Visit (INDEPENDENT_AMBULATORY_CARE_PROVIDER_SITE_OTHER): Payer: Medicaid Other | Admitting: Advanced Practice Midwife

## 2019-12-03 ENCOUNTER — Encounter: Payer: Self-pay | Admitting: Advanced Practice Midwife

## 2019-12-03 ENCOUNTER — Other Ambulatory Visit: Payer: Self-pay

## 2019-12-03 VITALS — BP 139/82 | HR 91 | Ht 62.0 in | Wt 121.0 lb

## 2019-12-03 DIAGNOSIS — N898 Other specified noninflammatory disorders of vagina: Secondary | ICD-10-CM | POA: Diagnosis not present

## 2019-12-03 LAB — POCT WET PREP (WET MOUNT)
Clue Cells Wet Prep Whiff POC: NEGATIVE
Trichomonas Wet Prep HPF POC: ABSENT

## 2019-12-03 NOTE — Progress Notes (Signed)
   GYN VISIT Patient name: Sandy Miller MRN [id number]  Date of birth: Sep 18, 2001 Chief Complaint:   vaginal discharge with odor  History of Present Illness:   Sandy Miller is a 19 y.o. G0P0000 African American female being seen today for vag discharge x past few months, white in color, occasional itching, 'doughy' odor.  She has never been sexually active. Is on contraceptive patch.  Patient's last menstrual period was 11/05/2019 (approximate). The current method of family planning is abstinence.  Last pap never.  Review of Systems:   Pertinent items are noted in HPI Denies fever/chills, dizziness, headaches, visual disturbances, fatigue, shortness of breath, chest pain, abdominal pain, vomiting, abnormal vaginal discharge/itching/odor/irritation, problems with periods, bowel movements, urination, or intercourse unless otherwise stated above.  Pertinent History Reviewed:  Reviewed past medical,surgical, social, obstetrical and family history.  Reviewed problem list, medications and allergies. Physical Assessment:   Vitals:   12/03/19 1131  BP: 139/82  Pulse: 91  Weight: 121 lb (54.9 kg)  Height: 5\' 2"  (1.575 m)  Body mass index is 22.13 kg/m.       Physical Examination:   General appearance: alert, well appearing, and in no distress  Mental status: alert, oriented to person, place, and time  Skin: warm & dry   Cardiovascular: normal heart rate noted  Respiratory: normal respiratory effort, no distress  Abdomen: soft, non-tender   Pelvic: normal external genitalia, vulva, vagina, cervix, uterus and adnexa, VULVA: normal appearing vulva with no masses, tenderness or lesions; sm white discharge seen at introitus  Extremities: no edema   Chaperone:    Results for orders placed or performed in visit on 12/03/19 (from the past 24 hour(s))  POCT Wet Prep 12/05/19 [facility])   Collection Time: 12/03/19 11:48 AM  Result Value Ref Range   Source Wet Prep POC vag     WBC, Wet Prep HPF POC few    Bacteria Wet Prep HPF POC Few Few   BACTERIA WET PREP MORPHOLOGY POC     Clue Cells Wet Prep HPF POC None None   Clue Cells Wet Prep Whiff POC Negative Whiff    Yeast Wet Prep HPF POC None None   KOH Wet Prep POC     Trichomonas Wet Prep HPF POC Absent Absent    Assessment & Plan:  1) Leukorrhea> no pathology, reviewed vag d/c changes with cycle   Meds: No orders of the defined types were placed in this encounter.   Orders Placed This Encounter  Procedures  . POCT Wet Prep [facility])    Return for prn.  [facility] CNM 12/03/2019 11:57 AM

## 2019-12-29 ENCOUNTER — Encounter: Payer: Self-pay | Admitting: Pediatrics

## 2019-12-29 ENCOUNTER — Other Ambulatory Visit: Payer: Self-pay

## 2019-12-29 ENCOUNTER — Ambulatory Visit (INDEPENDENT_AMBULATORY_CARE_PROVIDER_SITE_OTHER): Payer: Medicaid Other | Admitting: Pediatrics

## 2019-12-29 VITALS — Temp 97.9°F | Wt 120.5 lb

## 2019-12-29 DIAGNOSIS — R52 Pain, unspecified: Secondary | ICD-10-CM | POA: Diagnosis not present

## 2019-12-29 NOTE — Progress Notes (Signed)
Subjective:     Patient ID: Sandy Miller, female   DOB: [birth year], 19 y.o.   MRN: [id number]  HPI The patient is here for concerns about pain in her lower back as well as her arms and legs. She states that for the past several weeks, she has felt this pain. She states that it will last for about one week, the improve without her doing anything in particular for the area. She denies any known injury. She is not active in sports or exercising. She is worried it could be something like "MS" because she has a family member who has MS. No redness or swelling to any of the areas.  She currently takes Vit D once per week and medication for her hypothyroidism.  The patient has a history of depression and is not currently receiving any care for her mental health and states that she is not interested at this time.   The patient's mother is via phone, she states that she feels that her daughter needs "blood work" because her daughter "can't work.". She does not have any employment currently, but, her mother states that her daughter has complained of the cramps or pain in her body in a way that makes the mother worried about her daughter being able to work one day.   Histories reviewed by MD   Review of Systems .Review of Symptoms: General ROS: negative for - fatigue and weight loss ENT ROS: negative for - headaches or sore throat Respiratory ROS: no cough, shortness of breath, or wheezing Cardiovascular ROS: no chest pain or dyspnea on exertion Gastrointestinal ROS: no abdominal pain, change in bowel habits, or black or bloody stools Dermatological ROS: negative for rash     Objective:   Physical Exam Temp 97.9 F (36.6 C)   Wt 120 lb 8 oz (54.7 kg)   BMI 22.04 kg/m   General Appearance:  Alert, cooperative, no distress, appears stated age  Head:  Normocephalic, without obvious abnormality, atraumatic  Eyes:  PERRL, conjunctiva/corneas clear, EOM's intact, fundi benign, both eyes   Ears:  Normal TM's and external ear canals, both ears  Nose: Nares normal, septum midline,mucosa normal, no drainage or sinus tenderness  Throat: Lips, mucosa, and tongue normal; teeth and gums normal  Neck: Supple, symmetrical, trachea midline, no adenopathy;  thyroid: not enlarged, symmetric, no tenderness/mass/nodules; no carotid bruit or JVD  Back:   Symmetric, no curvature, ROM normal, no CVA tenderness  Lungs:   Clear to auscultation bilaterally, respirations unlabored  Heart:  Regular rate and rhythm, S1 and S2 normal, no murmur, rub, or gallop  Abdomen:   Soft, non-tender, bowel sounds active all four quadrants,  no masses, no organomegaly  Extremities: Extremities normal, atraumatic, no cyanosis or edema  Skin: Skin color, texture, turgor normal, no rashes or lesions  Lymph nodes: Cervical, supraclavicular nodes normal  Neurologic: Normal       Assessment:     Complaints of total body pain     Plan:      .1. Complaints of total body pain Normal exam today  Patient had evaluation one year ago by Neurology for tingling in her body, MD reviewed the cervical and brain MRI, it was normal, however, MD unable to see visit note with Neurology  Continue to monitor and journal symptoms, any triggers and what makes pain better  - Ambulatory referral to Rheumatology - Antinuclear Antib (ANA) - order form given with address for [facility] today  - CBC w/Diff/Platelet - Sed  Rate (ESR)  Patient declines referral for therapy today   RTC as needed or scheduled

## 2019-12-29 NOTE — Patient Instructions (Addendum)
Take order form to  [facility] on [address], see paper for more location information

## 2019-12-30 LAB — TSH: TSH: 7.85 mIU/L — ABNORMAL HIGH

## 2019-12-30 LAB — VITAMIN D 25 HYDROXY (VIT D DEFICIENCY, FRACTURES): Vit D, 25-Hydroxy: 56 ng/mL (ref 30–100)

## 2019-12-30 LAB — T4, FREE: Free T4: 1.2 ng/dL (ref 0.8–1.4)

## 2020-01-01 LAB — CBC WITH DIFFERENTIAL/PLATELET
Absolute Monocytes: 403 cells/uL (ref 200–950)
Basophils Absolute: 22 cells/uL (ref 0–200)
Basophils Relative: 0.3 %
Eosinophils Absolute: 43 cells/uL (ref 15–500)
Eosinophils Relative: 0.6 %
HCT: 40.8 % (ref 35.0–45.0)
Hemoglobin: 13.2 g/dL (ref 11.7–15.5)
Lymphs Abs: 3218 cells/uL (ref 850–3900)
MCH: 27.6 pg (ref 27.0–33.0)
MCHC: 32.4 g/dL (ref 32.0–36.0)
MCV: 85.4 fL (ref 80.0–100.0)
MPV: 9.5 fL (ref 7.5–12.5)
Monocytes Relative: 5.6 %
Neutro Abs: 3514 cells/uL (ref 1500–7800)
Neutrophils Relative %: 48.8 %
Platelets: 268 10*3/uL (ref 140–400)
RBC: 4.78 10*6/uL (ref 3.80–5.10)
RDW: 12.8 % (ref 11.0–15.0)
Total Lymphocyte: 44.7 %
WBC: 7.2 10*3/uL (ref 3.8–10.8)

## 2020-01-01 LAB — SEDIMENTATION RATE: Sed Rate: 6 mm/h (ref 0–20)

## 2020-01-01 LAB — ANTI-NUCLEAR AB-TITER (ANA TITER)
ANA TITER: 1:40 {titer} — ABNORMAL HIGH
ANA Titer 1: 1:40 {titer} — ABNORMAL HIGH

## 2020-01-01 LAB — ANA: Anti Nuclear Antibody (ANA): POSITIVE — AB

## 2020-01-02 ENCOUNTER — Ambulatory Visit (INDEPENDENT_AMBULATORY_CARE_PROVIDER_SITE_OTHER): Payer: Medicaid Other | Admitting: Pediatrics

## 2020-01-02 ENCOUNTER — Other Ambulatory Visit: Payer: Self-pay

## 2020-01-02 DIAGNOSIS — R52 Pain, unspecified: Secondary | ICD-10-CM | POA: Diagnosis not present

## 2020-01-02 DIAGNOSIS — R768 Other specified abnormal immunological findings in serum: Secondary | ICD-10-CM | POA: Diagnosis not present

## 2020-01-02 NOTE — Progress Notes (Signed)
Virtual Visit via Telephone Note  I connected with Sandy Miller on 01/02/20 at 12:30 PM EDT by telephone and verified that I am speaking with the correct person using two identifiers.   I discussed the limitations, risks, security and privacy concerns of performing an evaluation and management service by telephone and the availability of in person appointments. I also discussed with the patient that there may be a patient responsible charge related to this service. The patient expressed understanding and agreed to proceed.   History of Present Illness: The patient was seen in our office a few days ago with total body pain. She is currently under the care of Endocrinology for her hypothyroidism. She started to have pain throughout her body several weeks ago. She had labs obtained after her visit here - CBC with Diff, ANA, and ESR.    Observations/Objective: Patient is at home  MD is in clinic   Assessment and Plan: .1. ANA positive MD discussed results with patient and her mother via phone  - Ambulatory referral to Rheumatology  2. Total body pain - Ambulatory referral to Rheumatology  Answered questions and concerns of patient and mother on phone   Follow Up Instructions:    I discussed the assessment and treatment plan with the patient. The patient was provided an opportunity to ask questions and all were answered. The patient agreed with the plan and demonstrated an understanding of the instructions.   The patient was advised to call back or seek an in-person evaluation if the symptoms worsen or if the condition fails to improve as anticipated.  I provided 5 minutes of non-face-to-face time during this encounter.   Fransisca Connors, MD

## 2020-01-15 ENCOUNTER — Ambulatory Visit: Payer: BLUE CROSS/BLUE SHIELD | Admitting: "Endocrinology

## 2020-01-27 DIAGNOSIS — Z23 Encounter for immunization: Secondary | ICD-10-CM | POA: Diagnosis not present

## 2020-02-03 ENCOUNTER — Other Ambulatory Visit: Payer: Self-pay

## 2020-02-03 ENCOUNTER — Ambulatory Visit (INDEPENDENT_AMBULATORY_CARE_PROVIDER_SITE_OTHER): Payer: Medicaid Other | Admitting: "Endocrinology

## 2020-02-03 ENCOUNTER — Encounter: Payer: Self-pay | Admitting: "Endocrinology

## 2020-02-03 VITALS — BP 135/79 | HR 72 | Ht 62.0 in | Wt 127.8 lb

## 2020-02-03 DIAGNOSIS — E559 Vitamin D deficiency, unspecified: Secondary | ICD-10-CM

## 2020-02-03 DIAGNOSIS — E039 Hypothyroidism, unspecified: Secondary | ICD-10-CM

## 2020-02-03 MED ORDER — LEVOTHYROXINE SODIUM 75 MCG PO TABS: ORAL_TABLET | ORAL | 1 refills | Status: DC

## 2020-02-03 MED ORDER — LEVOTHYROXINE SODIUM 75 MCG PO TABS: ORAL_TABLET | ORAL | 3 refills | Status: DC

## 2020-02-03 NOTE — Progress Notes (Signed)
02/03/2020, 11:34 AM                                Endocrinology Telehealth Visit Follow up Note -During COVID -19 Pandemic  I connected with Sandy Miller on 02/03/2020   by telephone and verified that I am speaking with the correct person using two identifiers. Sandy Miller, [birth year]. she has verbally consented to this visit. All issues noted in this document were discussed and addressed. The format was not optimal for physical exam.   Subjective:    Patient ID: Sandy Miller, female    DOB: 12/07/2000, PCP Sandy Connors, MD   Past Medical History:  Diagnosis Date  . Anxiety   . Elevated TSH    Dr. [facility] Obtained Result of TSH 11 11/07/18 (results faxed to [facility])  . History of depression    History reviewed. No pertinent surgical history. Social History   Socioeconomic History  . Marital status: Single    Spouse name: Not on file  . Number of children: Not on file  . Years of education: Not on file  . Highest education level: Not on file  Occupational History  . Not on file  Tobacco Use  . Smoking status: Passive Smoke Exposure - Never Smoker  . Smokeless tobacco: Never Used  Substance and Sexual Activity  . Alcohol use: No  . Drug use: No  . Sexual activity: Never  Other Topics Concern  . Not on file  Social History Narrative   Lives with parents and younger brothers      No smokers      Graduated from high school    Social Determinants of Health   Financial Resource Strain:   . Difficulty of Paying Living Expenses:   Food Insecurity:   . Worried About Charity fundraiser in the Last Year:   . Arboriculturist in the Last Year:   Transportation Needs:   . Film/video editor (Medical):   Marland Kitchen Lack of Transportation (Non-Medical):   Physical Activity:   . Days of Exercise per Week:   . Minutes of Exercise per Session:   Stress:   . Feeling of Stress :    Social Connections:   . Frequency of Communication with Friends and Family:   . Frequency of Social Gatherings with Friends and Family:   . Attends Religious Services:   . Active Member of Clubs or Organizations:   . Attends Archivist Meetings:   Marland Kitchen Marital Status:    Family History  Problem Relation Age of Onset  . Other Father        Numbness of Unknown Etiology  . Mental illness Father   . Stomach cancer Maternal Grandfather        Died at 43  . Mental illness Mother        Bipolar  . Asthma Brother   . Multiple sclerosis Paternal Aunt    Outpatient Encounter Medications as of 02/03/2020  Medication Sig  . D3-50 1.25 MG (50000 UT) capsule Take 1 capsule (50,000 Units total) by mouth once a week.  . levothyroxine (SYNTHROID)  75 MCG tablet 50 mcg qam  . XULANE 150-35 MCG/24HR transdermal patch APPLY 1 PATCH TO SKIN ONCE A WEEK AS DIRECTED  . [DISCONTINUED] levothyroxine (SYNTHROID) 50 MCG tablet 50 mcg qam   No facility-administered encounter medications on file as of 02/03/2020.   ALLERGIES: No Known Allergies  VACCINATION STATUS: Immunization History  Administered Date(s) Administered  . DTaP 04/26/2000, 07/03/2000, 10/02/2000, 05/21/2001, 01/19/2005  . HPV Quadrivalent 05/07/2013, 04/01/2014, 04/30/2015  . Hepatitis A 03/05/2006, 02/11/2008  . Hepatitis B 2000/09/05, 10/02/2000, 02/25/2001  . HiB (PRP-OMP) 04/26/2000, 07/03/2000, 10/02/2000, 05/21/2001  . IPV 04/26/2000, 07/03/2000, 10/02/2000, 01/19/2005  . MMR 02/25/2001, 01/19/2005  . Meningococcal B Recombinant 05/02/2016, 09/13/2017  . Meningococcal Conjugate 04/01/2014, 05/02/2016  . Pneumococcal Conjugate-13 04/26/2000, 07/03/2000, 10/02/2000  . Tdap 04/24/2012  . Varicella 05/21/2001, 03/05/2006    HPI Sandy Miller is 19 y.o. female who is being seen in follow-up for the management of hypothyroidism, vitamin D deficiency.    During her previous visit, she was initiated on  levothyroxine.  She is currently on levothyroxine 50 mcg p.o. daily.  She reports reasonable compliance. -She continues to feel better including with better energy. She does not have any new complaints today.   - She has distant cousins with various forms of thyroid dysfunction, however no family history of thyroid malignancy.    -She denies palpitations, tremors, heat intolerance.  She is eating better, gaining weight. -She denies dysphagia, shortness of breath, nor voice change.   Review of Systems Limited as above.  Objective:    BP 135/79   Pulse 72   Ht 5' 2"  (1.575 m)   Wt 127 lb 12.8 oz (58 kg)   BMI 23.37 kg/m   Wt Readings from Last 3 Encounters:  02/03/20 127 lb 12.8 oz (58 kg) (49 %, Z= -0.03)*  12/29/19 120 lb 8 oz (54.7 kg) (35 %, Z= -0.39)*  12/03/19 121 lb (54.9 kg) (36 %, Z= -0.36)*   * Growth percentiles are based on CDC (Girls, 2-20 Years) data.      Physical Exam- Limited  Constitutional:  Body mass index is 23.37 kg/m. , not in acute distress, normal state of mind Eyes:  EOMI, no exophthalmos Neck: Supple Thyroid: No gross goiter Respiratory: Adequate breathing efforts Musculoskeletal: no gross deformities, strength intact in all four extremities, no gross restriction of joint movements Skin:  no rashes, no hyperemia Neurological: no tremor with outstretched hands,   CMP     Component Value Date/Time   NA 135 10/10/2013 1239   K 4.1 10/10/2013 1239   CL 99 10/10/2013 1239   CO2 25 10/10/2013 1239   GLUCOSE 78 10/10/2013 1239   BUN 9 10/10/2013 1239   CREATININE 0.87 10/10/2013 1239   CALCIUM 9.8 10/10/2013 1239     Diabetic Labs (most recent): Lab Results  Component Value Date   HGBA1C 5.3 11/07/2018   HGBA1C 5.7 (H) 10/10/2013     Lab Results  Component Value Date   TSH 7.85 (H) 12/30/2019   TSH 3.69 09/02/2019   TSH 4.58 (H) 05/08/2019   TSH 4.72 (H) 02/04/2019   TSH 11.04 (A) 11/07/2018   FREET4 1.2 12/30/2019   FREET4 1.2  09/02/2019   FREET4 1.3 05/08/2019   FREET4 1.1 02/04/2019      Assessment & Plan:   1.  Hypothyroidism. 2.  Vitamin D deficiency -Her previsit thyroid function tests are consistent with inadequate replacement.  I discussed and increased her levothyroxine to 75 mcg daily before breakfast.     -  We discussed about the correct intake of her thyroid hormone, on empty stomach at fasting, with water, separated by at least 30 minutes from breakfast and other medications,  and separated by more than 4 hours from calcium, iron, multivitamins, acid reflux medications (PPIs). -Patient is made aware of the fact that thyroid hormone replacement is needed for life, dose to be adjusted by periodic monitoring of thyroid function tests.  She is informed to notify clinic if she confirms or plans pregnancy, that she will need a higher dose during the first trimester.     She is advised to resume and finish her current supplies of ergocalciferol 50,000 units weekly, and continue vitamin D supplement with 5000 units daily.   - I advised her  to maintain close follow up with Sandy Connors, MD for primary care needs.     - Time spent on this patient care encounter:  25 minutes of which 50% was spent in  counseling and the rest reviewing  her current and  previous labs / studies and medications  doses and developing a plan for long term care. Sandy Miller  participated in the discussions, expressed understanding, and voiced agreement with the above plans.  All questions were answered to her satisfaction. she is encouraged to contact clinic should she have any questions or concerns prior to her return visit.    Follow up plan: Return in about 4 months (around 06/05/2020) for F/U with Pre-visit Labs.   Glade Lloyd, MD [facility] [address] [place], [place] [address] Phone: [phone]  Fax: [phone]     02/03/2020, 11:34  AM  This note was partially dictated with voice recognition software. Similar sounding words can be transcribed inadequately or may not  be corrected upon review.

## 2020-02-19 ENCOUNTER — Other Ambulatory Visit: Payer: Self-pay

## 2020-02-19 DIAGNOSIS — E039 Hypothyroidism, unspecified: Secondary | ICD-10-CM

## 2020-02-19 MED ORDER — LEVOTHYROXINE SODIUM 75 MCG PO TABS: ORAL_TABLET | ORAL | 1 refills | Status: DC

## 2020-02-23 DIAGNOSIS — Z23 Encounter for immunization: Secondary | ICD-10-CM | POA: Diagnosis not present

## 2020-05-11 ENCOUNTER — Other Ambulatory Visit: Payer: Self-pay | Admitting: Pediatrics

## 2020-05-28 DIAGNOSIS — E559 Vitamin D deficiency, unspecified: Secondary | ICD-10-CM | POA: Diagnosis not present

## 2020-05-28 DIAGNOSIS — E039 Hypothyroidism, unspecified: Secondary | ICD-10-CM | POA: Diagnosis not present

## 2020-05-28 LAB — VITAMIN D 25 HYDROXY (VIT D DEFICIENCY, FRACTURES): Vit D, 25-Hydroxy: 35 ng/mL (ref 30–100)

## 2020-05-28 LAB — TSH: TSH: 8.74 mIU/L — ABNORMAL HIGH

## 2020-05-28 LAB — T4, FREE: Free T4: 1.3 ng/dL (ref 0.8–1.4)

## 2020-06-09 ENCOUNTER — Ambulatory Visit: Payer: Medicaid Other | Admitting: "Endocrinology

## 2020-06-09 DIAGNOSIS — R29898 Other symptoms and signs involving the musculoskeletal system: Secondary | ICD-10-CM | POA: Diagnosis not present

## 2020-06-09 DIAGNOSIS — M2550 Pain in unspecified joint: Secondary | ICD-10-CM | POA: Diagnosis not present

## 2020-06-09 DIAGNOSIS — E039 Hypothyroidism, unspecified: Secondary | ICD-10-CM | POA: Diagnosis not present

## 2020-06-11 ENCOUNTER — Encounter: Payer: Self-pay | Admitting: Nurse Practitioner

## 2020-06-11 ENCOUNTER — Telehealth (INDEPENDENT_AMBULATORY_CARE_PROVIDER_SITE_OTHER): Payer: Medicaid Other | Admitting: Nurse Practitioner

## 2020-06-11 ENCOUNTER — Other Ambulatory Visit: Payer: Self-pay

## 2020-06-11 DIAGNOSIS — E039 Hypothyroidism, unspecified: Secondary | ICD-10-CM

## 2020-06-11 DIAGNOSIS — E559 Vitamin D deficiency, unspecified: Secondary | ICD-10-CM | POA: Diagnosis not present

## 2020-06-11 NOTE — Patient Instructions (Signed)

## 2020-06-11 NOTE — Progress Notes (Signed)
06/11/2020, 10:05 AM       Endocrinology Follow Up Visit   TELEHEALTH VISIT: The patient is being engaged in telehealth visit due to COVID-19.  This type of visit limits physical examination significantly, and thus is not preferable over face-to-face encounters.  I connected with  Sandy Miller on 06/11/20 by a video enabled telemedicine application and verified that I am speaking with the correct person using two identifiers.   I discussed the limitations of evaluation and management by telemedicine. The patient expressed understanding and agreed to proceed.    The participants involved in this visit include: Brita Romp, NP located at [facility] and Nell Range T Schall  located at their personal residence listed.   Subjective:    Patient ID: Sandy Miller, female    DOB: [birth year], PCP Fransisca Connors, MD   Past Medical History:  Diagnosis Date  . Anxiety   . Elevated TSH    [facility] Obtained Result of TSH 11 11/07/18 (results faxed to [facility])  . History of depression    No past surgical history on file. Social History   Socioeconomic History  . Marital status: Single    Spouse name: Not on file  . Number of children: Not on file  . Years of education: Not on file  . Highest education level: Not on file  Occupational History  . Not on file  Tobacco Use  . Smoking status: Passive Smoke Exposure - Never Smoker  . Smokeless tobacco: Never Used  Vaping Use  . Vaping Use: Never used  Substance and Sexual Activity  . Alcohol use: No  . Drug use: No  . Sexual activity: Never  Other Topics Concern  . Not on file  Social History Narrative   Lives with parents and younger brothers      No smokers      Graduated from high school    Social Determinants of Health   Financial Resource Strain:   . Difficulty of Paying Living Expenses: Not on file  Food  Insecurity:   . Worried About Charity fundraiser in the Last Year: Not on file  . Ran Out of Food in the Last Year: Not on file  Transportation Needs:   . Lack of Transportation (Medical): Not on file  . Lack of Transportation (Non-Medical): Not on file  Physical Activity:   . Days of Exercise per Week: Not on file  . Minutes of Exercise per Session: Not on file  Stress:   . Feeling of Stress : Not on file  Social Connections:   . Frequency of Communication with Friends and Family: Not on file  . Frequency of Social Gatherings with Friends and Family: Not on file  . Attends Religious Services: Not on file  . Active Member of Clubs or Organizations: Not on file  . Attends Archivist Meetings: Not on file  . Marital Status: Not on file   Family History  Problem Relation Age of Onset  . Other Father        Numbness of Unknown Etiology  . Mental illness Father   . Stomach cancer Maternal Grandfather  Died at 69  . Mental illness Mother        Bipolar  . Asthma Brother   . Multiple sclerosis Paternal Aunt    Outpatient Encounter Medications as of 06/11/2020  Medication Sig  . D3-50 1.25 MG (50000 UT) capsule Take 1 capsule (50,000 Units total) by mouth once a week.  . levothyroxine (SYNTHROID) 75 MCG tablet As directed  . XULANE 150-35 MCG/24HR transdermal patch APPLY 1 PATCH TO SKIN ONCE A WEEK AS DIRECTED   No facility-administered encounter medications on file as of 06/11/2020.   ALLERGIES: No Known Allergies  VACCINATION STATUS: Immunization History  Administered Date(s) Administered  . DTaP 04/26/2000, 07/03/2000, 10/02/2000, 05/21/2001, 01/19/2005  . HPV Quadrivalent 05/07/2013, 04/01/2014, 04/30/2015  . Hepatitis A 03/05/2006, 02/11/2008  . Hepatitis B Jan 01, 2000, 10/02/2000, 02/25/2001  . HiB (PRP-OMP) 04/26/2000, 07/03/2000, 10/02/2000, 05/21/2001  . IPV 04/26/2000, 07/03/2000, 10/02/2000, 01/19/2005  . MMR 02/25/2001, 01/19/2005  . Meningococcal  B Recombinant 05/02/2016, 09/13/2017  . Meningococcal Conjugate 04/01/2014, 05/02/2016  . Pneumococcal Conjugate-13 04/26/2000, 07/03/2000, 10/02/2000  . Tdap 04/24/2012  . Varicella 05/21/2001, 03/05/2006    Thyroid Problem Presents for follow-up visit. Patient reports no anxiety, cold intolerance, constipation, depressed mood, diarrhea, fatigue, heat intolerance, menstrual problem, palpitations, tremors, weight gain or weight loss. The symptoms have been stable.   Sandy Miller is 19 y.o. female who is being seen in follow-up for the management of hypothyroidism, vitamin D deficiency.    She is currently on levothyroxine 50 mcg p.o. daily.  She did not increase her dose as advised because she still had 50 mcg pills left over.  She also reports she does not take this medication on an empty stomach.  She often takes it midday or in the evening. -She continues to feel better including with better energy. She does not have any new complaints today.   - She has distant cousins with various forms of thyroid dysfunction, however no family history of thyroid malignancy.    -She denies palpitations, tremors, heat intolerance.  She is eating better, gaining weight. -She denies dysphagia, shortness of breath, nor voice change.   Review of systems  Constitutional: + Minimally fluctuating body weight,  current There is no height or weight on file to calculate BMI. , no fatigue, no subjective hyperthermia, no subjective hypothermia Eyes: no blurry vision, no xerophthalmia ENT: no sore throat, no nodules palpated in throat, no dysphagia/odynophagia, no hoarseness Cardiovascular: no chest pain, no shortness of breath, no palpitations, no leg swelling Respiratory: no cough, no shortness of breath Gastrointestinal: no nausea/vomiting/diarrhea Musculoskeletal: no muscle/joint aches Skin: no rashes, no hyperemia Neurological: no tremors, no numbness, no tingling, no dizziness Psychiatric: no  depression, no anxiety   Objective:    There were no vitals taken for this visit.   Wt Readings from Last 3 Encounters:  02/03/20 127 lb 12.8 oz (58 kg) (49 %, Z= -0.03)*  12/29/19 120 lb 8 oz (54.7 kg) (35 %, Z= -0.39)*  12/03/19 121 lb (54.9 kg) (36 %, Z= -0.36)*   * Growth percentiles are based on CDC (Girls, 2-20 Years) data.    BP Readings from Last 3 Encounters:  02/03/20 135/79  12/03/19 139/82  10/21/19 116/80     Physical Exam- Telehealth- significantly limited due to nature of visit  Constitutional: There is no height or weight on file to calculate BMI. , not in acute distress, disengaged, flat affect Respiratory: Adequate breathing efforts  CMP     Component Value Date/Time  NA 135 10/10/2013 1239   K 4.1 10/10/2013 1239   CL 99 10/10/2013 1239   CO2 25 10/10/2013 1239   GLUCOSE 78 10/10/2013 1239   BUN 9 10/10/2013 1239   CREATININE 0.87 10/10/2013 1239   CALCIUM 9.8 10/10/2013 1239     Diabetic Labs (most recent): Lab Results  Component Value Date   HGBA1C 5.3 11/07/2018   HGBA1C 5.7 (H) 10/10/2013     Lab Results  Component Value Date   TSH 8.74 (H) 05/28/2020   TSH 7.85 (H) 12/30/2019   TSH 3.69 09/02/2019   TSH 4.58 (H) 05/08/2019   TSH 4.72 (H) 02/04/2019   TSH 11.04 (A) 11/07/2018   FREET4 1.3 05/28/2020   FREET4 1.2 12/30/2019   FREET4 1.2 09/02/2019   FREET4 1.3 05/08/2019   FREET4 1.1 02/04/2019      Assessment & Plan:   1.  Hypothyroidism: -Her previsit thyroid function tests are consistent with inadequate replacement.  Likely due to her inconsistency with taking her medication and not starting the 75 mcg as advised.  She is advised to start taking levothyroxine 75 mcg po daily before breakfast on a consistent basis.   - We discussed about the correct intake of her thyroid hormone, on empty stomach at fasting, with water, separated by at least 30 minutes from breakfast and other medications,  and separated by more than 4  hours from calcium, iron, multivitamins, acid reflux medications (PPIs). -Patient is made aware of the fact that thyroid hormone replacement is needed for life, dose to be adjusted by periodic monitoring of thyroid function tests.  She is informed to notify clinic if she confirms or plans pregnancy, that she will need a higher dose during the first trimester.    2.  Vitamin D deficiency: Her vitamin D level on 05/28/20 has improved to 35.  She is no longer taking any supplements.  She can stay of supplementation for now until next recheck.   - I advised her  to maintain close follow up with Fransisca Connors, MD for primary care needs.   I spent 20 minutes dedicated to the care of this patient on the date of this encounter to include pre-visit review of records, face-to-face time with the patient, and post visit ordering of  testing. Sandy Miller  participated in the discussions, expressed understanding, and voiced agreement with the above plans.  All questions were answered to her satisfaction. she is encouraged to contact clinic should she have any questions or concerns prior to her return visit.    Follow up plan: Return in about 4 months (around 10/11/2020) for Thyroid follow up, Previsit labs, Virtual visit ok.   Rayetta Pigg, [facility] [address] [place], [place] [address] Phone: [phone] Fax: [phone]   06/11/2020, 10:05 AM  This note was partially dictated with voice recognition software. Similar sounding words can be transcribed inadequately or may not  be corrected upon review.

## 2020-06-15 ENCOUNTER — Telehealth: Payer: Self-pay | Admitting: *Deleted

## 2020-06-15 ENCOUNTER — Telehealth: Payer: Self-pay | Admitting: Pediatrics

## 2020-06-15 NOTE — Telephone Encounter (Signed)
Mom called stating patient went to [place] for neurology appt, mom would like to speak with someone regarding this appt. Per mom they told her this was not a neurology problem. Please call [phone]

## 2020-06-15 NOTE — Telephone Encounter (Cosign Needed)
Spoke with mother, Kaena Santori, regarding patient's appointment with neurology. Mother informed that the plan per neurology was to have the patient consult with rheumatology. Dr. Meredeth Ide made aware of the need for the referral to rheumatology and referral will be submitted. Mother understands expectations and process of referral placement and has no further questions at this time.

## 2020-06-18 ENCOUNTER — Other Ambulatory Visit: Payer: Self-pay | Admitting: Pediatrics

## 2020-06-18 DIAGNOSIS — M791 Myalgia, unspecified site: Secondary | ICD-10-CM

## 2020-06-21 ENCOUNTER — Ambulatory Visit (INDEPENDENT_AMBULATORY_CARE_PROVIDER_SITE_OTHER): Payer: Medicaid Other | Admitting: Pediatrics

## 2020-06-21 ENCOUNTER — Other Ambulatory Visit: Payer: Self-pay

## 2020-06-21 ENCOUNTER — Encounter: Payer: Self-pay | Admitting: Pediatrics

## 2020-06-21 VITALS — Wt 119.0 lb

## 2020-06-21 DIAGNOSIS — M79675 Pain in left toe(s): Secondary | ICD-10-CM | POA: Diagnosis not present

## 2020-06-21 DIAGNOSIS — M7989 Other specified soft tissue disorders: Secondary | ICD-10-CM

## 2020-06-21 MED ORDER — IBUPROFEN 400 MG PO TABS: ORAL_TABLET | ORAL | 0 refills | Status: DC

## 2020-06-21 NOTE — Progress Notes (Signed)
  Subjective:     Patient ID: Sandy Miller, female   DOB: 03/17/2001, 19 y.o.   MRN: [id number]  HPI  The patient is here today alone for concern about toe swelling after a tick bite. The patient states that around 3am on Saturday morning, she noticed a tick between her 2nd and 3rd toe of her foot. She then easily removed the tick and went back to sleep. She states that since then, her toes appear "swollen." No redness. No discharge. No fevers.  She states that when walking with her dog, there are ticks in that area.  She has not taken any medications.  Histories reviewed by MD    Review of Systems .Review of Symptoms: General ROS: negative for - chills and fever ENT ROS: negative for - headaches Respiratory ROS: no cough, shortness of breath, or wheezing Gastrointestinal ROS: no abdominal pain, change in bowel habits, or black or bloody stools Neurological ROS: negative for - numbness/tingling     Objective:   Physical Exam Wt 119 lb (54 kg)   BMI 21.77 kg/m   General Appearance:  Alert, cooperative,  appears stated age  Head:  Normocephalic, without obvious abnormality, atraumatic  Eyes:  Conjunctiva/corneas clear, EOM's intact  Extremities: Extremities normal, atraumatic, no cyanosis, mild swelling of 2nd, 3rd, and 4th left toes   Skin: Skin color, texture, turgor normal, no rashes or lesions       Assessment:     Pain and swelling of toe of left foot     Plan:     .1. Pain and swelling of toe of left foot Continue with ice as needed Elevation of left foot  Discussed wearing socks and pants when walking dog, etc and checking for ticks  - ibuprofen (ADVIL) 400 MG tablet; Take one tablet every 8 hours as needed for pain and swelling for up to 5 days  Dispense: 20 tablet; Refill: 0  RTC as needed

## 2020-06-29 ENCOUNTER — Other Ambulatory Visit: Payer: Self-pay

## 2020-06-29 ENCOUNTER — Telehealth (INDEPENDENT_AMBULATORY_CARE_PROVIDER_SITE_OTHER): Payer: Medicaid Other | Admitting: Pediatrics

## 2020-06-29 DIAGNOSIS — L989 Disorder of the skin and subcutaneous tissue, unspecified: Secondary | ICD-10-CM | POA: Diagnosis not present

## 2020-06-29 NOTE — Progress Notes (Signed)
Virtual Visit via Video Note  I connected with Sandy Miller on 06/29/20 at  5:00 PM EDT by a video enabled telemedicine application and verified that I am speaking with the correct person using two identifiers.   I discussed the limitations of evaluation and management by telemedicine and the availability of in person appointments. The patient expressed understanding and agreed to proceed.  History of Present Illness: The patient states that in an area of a tick bite on her right foot, she has noticed a bump. The bump is sometimes painful with walking, but otherwise does not bother her. It has been present for about 2 to 3 days. She states that it looks "swollen". No redness or drainage from the area. She states that before it started to look this way, she was scratching it a lot.    Observations/Objective: MD is in clinic Patient is at home  Assessment and Plan: .1. Skin lesion MD discussed to not scratch or pick at the area  Routine skin care If the areas does not improve over the next 2 - 3 days, call our clinic  Follow Up Instructions:    I discussed the assessment and treatment plan with the patient. The patient was provided an opportunity to ask questions and all were answered. The patient agreed with the plan and demonstrated an understanding of the instructions.   The patient was advised to call back or seek an in-person evaluation if the symptoms worsen or if the condition fails to improve as anticipated.  I provided 6 minutes of non-face-to-face time during this encounter.   Rosiland Oz, MD

## 2020-07-14 ENCOUNTER — Other Ambulatory Visit: Payer: Self-pay

## 2020-07-14 ENCOUNTER — Ambulatory Visit (INDEPENDENT_AMBULATORY_CARE_PROVIDER_SITE_OTHER): Payer: Medicaid Other | Admitting: Licensed Clinical Social Worker

## 2020-07-14 DIAGNOSIS — F4324 Adjustment disorder with disturbance of conduct: Secondary | ICD-10-CM | POA: Diagnosis not present
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
## 2020-07-21 ENCOUNTER — Encounter: Payer: Self-pay | Admitting: Internal Medicine

## 2020-07-21 ENCOUNTER — Ambulatory Visit: Payer: Medicaid Other | Admitting: Internal Medicine

## 2020-07-21 ENCOUNTER — Other Ambulatory Visit: Payer: Self-pay

## 2020-07-21 VITALS — BP 101/64 | HR 83 | Ht 62.25 in | Wt 119.2 lb

## 2020-07-21 DIAGNOSIS — R7689 Other specified abnormal immunological findings in serum: Secondary | ICD-10-CM | POA: Insufficient documentation

## 2020-07-21 DIAGNOSIS — R768 Other specified abnormal immunological findings in serum: Secondary | ICD-10-CM

## 2020-07-21 DIAGNOSIS — E039 Hypothyroidism, unspecified: Secondary | ICD-10-CM

## 2020-07-21 DIAGNOSIS — M2550 Pain in unspecified joint: Secondary | ICD-10-CM

## 2020-07-21 NOTE — Patient Instructions (Signed)
Your joint pain may be due to inflammatory arthritis although I do not see clear changes on exam today. Your blood tests with [facility] were positive for antibodies commonly associated with rheumatoid arthritis. I would like to repeat this test along with inflammatory markers for evidence of this.  I suspect your hypothyroidism may still be a problem and would like to recheck for TSH, although sometimes this lags behind the treatment in correcting.  We can consider hydroxychloroquine (plaquenil) as an initial treatment option for possible inflammatory arthritis. You can review information about this and we can discuss starting at follow up.  Hydroxychloroquine tablets What is this medicine? HYDROXYCHLOROQUINE (hye drox ee KLOR oh kwin) is used to treat rheumatoid arthritis and systemic lupus erythematosus. It is also used to treat malaria. This medicine may be used for other purposes; ask your health care provider or pharmacist if you have questions. COMMON BRAND NAME(S): Plaquenil, Quineprox What should I tell my health care provider before I take this medicine? They need to know if you have any of these conditions:  diabetes  eye disease, vision problems  G6PD deficiency  heart disease  history of irregular heartbeat  if you often drink alcohol  kidney disease  liver disease  porphyria  psoriasis  an unusual or allergic reaction to chloroquine, hydroxychloroquine, other medicines, foods, dyes, or preservatives  pregnant or trying to get pregnant  breast-feeding How should I use this medicine? Take this medicine by mouth with a glass of water. Follow the directions on the prescription label. Do not cut, crush or chew this medicine. Swallow the tablets whole. Take this medicine with food. Avoid taking antacids within 4 hours of taking this medicine. It is best to separate these medicines by at least 4 hours. Take your medicine at regular intervals. Do not take  it more often than directed. Take all of your medicine as directed even if you think you are better. Do not skip doses or stop your medicine early. Talk to your pediatrician regarding the use of this medicine in children. While this drug may be prescribed for selected conditions, precautions do apply. Overdosage: If you think you have taken too much of this medicine contact a poison control center or emergency room at once. NOTE: This medicine is only for you. Do not share this medicine with others. What if I miss a dose? If you miss a dose, take it as soon as you can. If it is almost time for your next dose, take only that dose. Do not take double or extra doses. What may interact with this medicine? Do not take this medicine with any of the following medications:  cisapride  dronedarone  pimozide  thioridazine This medicine may also interact with the following medications:  ampicillin  antacids  cimetidine  cyclosporine  digoxin  kaolin  medicines for diabetes, like insulin, glipizide, glyburide  medicines for seizures like carbamazepine, phenobarbital, phenytoin  mefloquine  methotrexate  other medicines that prolong the QT interval (cause an abnormal heart rhythm)  praziquantel This list may not describe all possible interactions. Give your health care provider a list of all the medicines, herbs, non-prescription drugs, or dietary supplements you use. Also tell them if you smoke, drink alcohol, or use illegal drugs. Some items may interact with your medicine. What should I watch for while using this medicine? Visit your health care professional for regular checks on your progress. Tell your health care professional if your symptoms do not start to get  better or if they get worse. You may need blood work done while you are taking this medicine. If you take other medicines that can affect heart rhythm, you may need more testing. Talk to your health care professional if  you have questions. Your vision may be tested before and during use of this medicine. Tell your health care professional right away if you have any change in your eyesight. What side effects may I notice from receiving this medicine? Side effects that you should report to your doctor or health care professional as soon as possible:  allergic reactions like skin rash, itching or hives, swelling of the face, lips, or tongue  changes in vision  decreased hearing or ringing of the ears  muscle weakness  redness, blistering, peeling or loosening of the skin, including inside the mouth  sensitivity to light  signs and symptoms of a dangerous change in heartbeat or heart rhythm like chest pain; dizziness; fast or irregular heartbeat; palpitations; feeling faint or lightheaded, falls; breathing problems  signs and symptoms of liver injury like dark yellow or brown urine; general ill feeling or flu-like symptoms; light-colored stools; loss of appetite; nausea; right upper belly pain; unusually weak or tired; yellowing of the eyes or skin  signs and symptoms of low blood sugar such as feeling anxious; confusion; dizziness; increased hunger; unusually weak or tired; sweating; shakiness; cold; irritable; headache; blurred vision; fast heartbeat; loss of consciousness  suicidal thoughts  uncontrollable head, mouth, neck, arm, or leg movements Side effects that usually do not require medical attention (report to your doctor or health care professional if they continue or are bothersome):  diarrhea  dizziness  hair loss  headache  irritable  loss of appetite  nausea, vomiting  stomach pain This list may not describe all possible side effects. Call your doctor for medical advice about side effects. You may report side effects to FDA at 1-800-FDA-1088. Where should I keep my medicine? Keep out of the reach of children. Store at room temperature between 15 and 30 degrees C (59 and 86  degrees F). Protect from moisture and light. Throw away any unused medicine after the expiration date. NOTE: This sheet is a summary. It may not cover all possible information. If you have questions about this medicine, talk to your doctor, pharmacist, or health care provider.  2020 Elsevier/Gold Standard (2019-01-13 12:56:32)

## 2020-07-21 NOTE — Progress Notes (Signed)
Office Visit Note  Patient: Sandy Miller             Date of Birth: 10-18-2000           MRN: [id number]             PCP: Fransisca Connors, MD Referring: Fransisca Connors, MD Visit Date: 07/21/2020  Subjective:   History of Present Illness: Sandy Miller is a 19 y.o. female with a history of hypothyroidism here for evaluation of positive ANA checked in association with polyarthralgias and sensation changes. Her symptoms are longstanding she recalls some first age 51 and the initial episode was associated with bilateral hand bluish discoloration pain and swelling. She says this was evaluated initially and felt to represent tendinitis. Residual discoloration lasted for at least a few weeks concerning for a vasculitis problem but laboratory work-up was unremarkable. Since that time she has had symptoms intermittently usually just joint pain without the swelling discoloration. She was evaluated by neurology in 2013 where possibility of MS was considered but not strongly suspected. Evaluation 2015 for bilateral hand discoloration and paresthesia thought to possibly represent dysautonomia but not thought to have generalized disorder. She was evaluated for right knee pain in 2019 but was associated with acute pain following dance activity. She was evaluated with endocrinology starting last year for elevated TSH though rest of thyroid function panel showed only very mildly low free T3 of 2.9 other thyroid levels were normal. She was started on levothyroxine for this the dose of this medication was increased to 75 mcg at last visit TSH was still elevated but thought to be due to patient not taking the medicine optimally inconsistently. She was seen by neurology in September of this year with patchy sensation changes noted but no length dependent or dermatomal distribution of changes laboratory work-up at that time was positive for anti-CCP antibodies at a low level.  She feels her pain is  increased with heavier exertion during the day and once acting up can take about 5 days before improving. Symptoms are significant that she has lost at least one job position due to slowed or missed work. She denies alopecia, eye inflammation, oral ulcers, lymphadenopathy, pleurisy, Raynaud's phenomenon, or blood clots. She has had some hyperpigmented facial rash previously attributed to melasma. She reports some decrease in weight compared to earlier this year down from 127 pounds to 119.   Labs reviewed 12/2019 ANA 1:40 cytoplasmic, nuclear  ESR 6  05/2020 TSH 8.74 CCP Ab 23.29 RF negative CK 46   Activities of Daily Living:  Patient reports morning stiffness for 5 minutes.   Patient Denies nocturnal pain.  Difficulty dressing/grooming: Denies Difficulty climbing stairs: Reports Difficulty getting out of chair: Denies Difficulty using hands for taps, buttons, cutlery, and/or writing: Reports  Review of Systems  Constitutional: Positive for fatigue.  HENT: Negative for mouth sores, mouth dryness and nose dryness.   Eyes: Negative for pain, itching, visual disturbance and dryness.  Respiratory: Negative for cough, hemoptysis, shortness of breath and difficulty breathing.   Cardiovascular: Negative for chest pain, palpitations and swelling in legs/feet.  Gastrointestinal: Negative for abdominal pain, blood in stool, constipation and diarrhea.  Endocrine: Negative for increased urination.  Genitourinary: Negative for painful urination.  Musculoskeletal: Positive for arthralgias, joint pain, joint swelling, myalgias, muscle weakness, morning stiffness and myalgias. Negative for muscle tenderness.  Skin: Negative for color change, rash and redness.  Allergic/Immunologic: Positive for susceptible to infections.  Neurological: Positive for memory  loss and weakness. Negative for dizziness, numbness and headaches.  Hematological: Negative for swollen glands.  Psychiatric/Behavioral:  Positive for sleep disturbance. Negative for confusion.    PMFS History:  Patient Active Problem List   Diagnosis Date Noted  . Positive ANA (antinuclear antibody) 07/21/2020  . Polyarthralgia 07/21/2020  . Contraceptive patch status 04/07/2019  . Hypothyroidism 02/11/2019  . Vitamin D deficiency 02/04/2019  . Depression with anxiety 04/04/2016  . Stress at home 04/04/2016  . Numbness in both hands 02/10/2014  . Pallor of extremity 02/10/2014  . Melasma 08/21/2013  . Keloid of skin 08/21/2013    Past Medical History:  Diagnosis Date  . Anxiety   . Elevated TSH    [facility] Obtained Result of TSH 11 11/07/18 (results faxed to [facility])  . History of depression     Family History  Problem Relation Age of Onset  . Other Father        Numbness of Unknown Etiology  . Mental illness Father   . Arthritis Father   . Stomach cancer Maternal Grandfather        Died at 41  . Mental illness Mother        Bipolar  . Asthma Brother   . Arthritis Paternal Grandfather   . Multiple sclerosis Paternal Aunt    Past Surgical History:  Procedure Laterality Date  . WISDOM TOOTH EXTRACTION  2017   Per patient   Social History   Social History Narrative   Lives with parents and younger brothers      No smokers      Graduated from high school    Immunization History  Administered Date(s) Administered  . DTaP 04/26/2000, 07/03/2000, 10/02/2000, 05/21/2001, 01/19/2005  . HPV Quadrivalent 05/07/2013, 04/01/2014, 04/30/2015  . Hepatitis A 03/05/2006, 02/11/2008  . Hepatitis B 09-06-2000, 10/02/2000, 02/25/2001  . HiB (PRP-OMP) 04/26/2000, 07/03/2000, 10/02/2000, 05/21/2001  . IPV 04/26/2000, 07/03/2000, 10/02/2000, 01/19/2005  . MMR 02/25/2001, 01/19/2005  . Meningococcal B Recombinant 05/02/2016, 09/13/2017  . Meningococcal Conjugate 04/01/2014, 05/02/2016  . Moderna SARS-COVID-2 Vaccination 02/04/2020, 02/18/2020  . Pneumococcal Conjugate-13 04/26/2000, 07/03/2000,  10/02/2000  . Tdap 04/24/2012  . Varicella 05/21/2001, 03/05/2006     Objective: Vital Signs: BP 101/64 (BP Location: Left Arm, Patient Position: Sitting, Cuff Size: Small)   Pulse 83   Ht 5' 2.25" (1.581 m)   Wt 119 lb 3.2 oz (54.1 kg)   BMI 21.63 kg/m    Physical Exam HENT:     Head: Normocephalic.     Right Ear: External ear normal.     Left Ear: External ear normal.     Nose: Nose normal.     Mouth/Throat:     Mouth: Mucous membranes are moist.     Pharynx: Oropharynx is clear.  Eyes:     Conjunctiva/sclera: Conjunctivae normal.  Cardiovascular:     Rate and Rhythm: Normal rate and regular rhythm.  Pulmonary:     Effort: Pulmonary effort is normal.     Breath sounds: Normal breath sounds.  Skin:    General: Skin is warm and dry.  Neurological:     Mental Status: She is alert.     Comments: No focal sensorimotor deficit Bilateral knee jerk reflexes are diminished but intact ankle jerk reflexes  Psychiatric:     Comments: Flat affect   Nailfold capillaries appear normal  Musculoskeletal Exam:  Neck full range of motion no tenderness Shoulders full range of motion no tenderness or swelling Right elbow tenderness to  palpation over olecranon without swelling or altered range of motion, left elbow normal Right wrist and proximal finger joints are generally tender to pressure but normal range of motion no swelling appreciated, left wrist and left finger joints normal Normal hip internal and external rotation without pain, no tenderness to lateral hip palpation Knees, ankles, MTPs full range of motion no tenderness or swelling  CDAI Exam: CDAI Score: - Patient Global: -; Provider Global: - Swollen: 0 ; Tender: 6  Joint Exam 07/21/2020      Right  Left  Elbow   Tender     Wrist   Tender     MCP 2   Tender     MCP 3   Tender     MCP 4   Tender     MCP 5   Tender        Investigation: No additional findings.  Imaging: No results found.  Recent Labs: Lab  Results  Component Value Date   WBC 7.2 12/30/2019   HGB 13.2 12/30/2019   PLT 268 12/30/2019   NA 135 10/10/2013   K 4.1 10/10/2013   CL 99 10/10/2013   CO2 25 10/10/2013   GLUCOSE 78 10/10/2013   BUN 9 10/10/2013   CREATININE 0.87 10/10/2013   CALCIUM 9.8 10/10/2013    Speciality Comments: No specialty comments available.  Procedures:  No procedures performed Allergies: Patient has no known allergies.   Assessment / Plan:     Visit Diagnoses: Positive ANA (antinuclear antibody)  ANA positive at 1:40 titer is not highly specific and she does not present clinical criteria of systemic lupus.  Low positive ANA can also be seen associated with other conditions such as thyroid disease.  History of limb discoloration vaguely suggestive for vascular process but the very intermittent and atypical for autoimmune vasculitis.  From exam and clinical history prior work-up see no clear evidence for ANA related autoimmune disease no specific additional work-up for this.  Hypothyroidism, unspecified type - Plan: TSH  Elevated TSH which has never been at goal although free T4 was normal and free T3 only very slightly low 2.9 even before starting treatment with levothyroxine.  May be subclinical hypothyroidism regardless she is seeing endocrinology for this problem.  Based on last TSH still at 8.74 combined with partially diminished patellar reflexes bilaterally could be a contributor to ongoing symptoms so would recheck for TSH since she increased her medicine.  Polyarthralgia - Plan: Cyclic citrul peptide antibody, IgG, Rheumatoid factor, Sedimentation rate, C-reactive protein  Mildly positive CCP antibody but test has high specificity so strongly suggest for inflammatory arthritis.  She does not have any inflammatory changes evident on physical exam today despite having multiple tender joints on the right arm.  The distribution of involved joints is unusual although with her age of onset but  question is this a continuation of a possible TIA versus atypical new onset RA.  I also do not know how this would explain these previous episodes of bilateral hand discoloration.  We will repeat RA associated antibodies and serum inflammatory markers today with a low threshold to start a trial of hydroxychloroquine could since this could represent early RA with low disease activity.  Orders: Orders Placed This Encounter  Procedures  . Cyclic citrul peptide antibody, IgG  . Rheumatoid factor  . Sedimentation rate  . C-reactive protein  . TSH   No orders of the defined types were placed in this encounter.   Follow-Up Instructions: Return in about  2 weeks (around 08/04/2020).   Collier Salina, MD  Note - This record has been created using [employer].  Chart creation errors have been sought, but may not always  have been located. Such creation errors do not reflect on  the standard of medical care.

## 2020-07-22 DIAGNOSIS — M2550 Pain in unspecified joint: Secondary | ICD-10-CM | POA: Diagnosis not present

## 2020-07-22 DIAGNOSIS — E039 Hypothyroidism, unspecified: Secondary | ICD-10-CM | POA: Diagnosis not present

## 2020-07-23 LAB — TSH: TSH: 1.29 mIU/L

## 2020-07-23 LAB — SEDIMENTATION RATE: Sed Rate: 2 mm/h (ref 0–20)

## 2020-07-23 LAB — C-REACTIVE PROTEIN: CRP: 0.7 mg/L (ref <8.0)

## 2020-07-23 LAB — CYCLIC CITRUL PEPTIDE ANTIBODY, IGG: Cyclic Citrullin Peptide Ab: <16 UNITS

## 2020-07-23 LAB — RHEUMATOID FACTOR: Rheumatoid fact SerPl-aCnc: <14 IU/mL (ref <14)

## 2020-07-26 NOTE — Progress Notes (Signed)
Lab tests associated with rheumatoid arthritis and inflammation were negative. TSH is now down to a normal value so with the change since a few months ago her thyroid medicine is now working adequately.

## 2020-08-04 ENCOUNTER — Other Ambulatory Visit: Payer: Self-pay

## 2020-08-04 ENCOUNTER — Encounter: Payer: Self-pay | Admitting: Internal Medicine

## 2020-08-04 ENCOUNTER — Ambulatory Visit: Payer: Medicaid Other | Admitting: Internal Medicine

## 2020-08-04 VITALS — BP 109/63 | HR 92 | Ht 62.0 in | Wt 119.0 lb

## 2020-08-04 DIAGNOSIS — R768 Other specified abnormal immunological findings in serum: Secondary | ICD-10-CM

## 2020-08-04 DIAGNOSIS — M797 Fibromyalgia: Secondary | ICD-10-CM

## 2020-08-04 DIAGNOSIS — E039 Hypothyroidism, unspecified: Secondary | ICD-10-CM | POA: Diagnosis not present

## 2020-08-04 NOTE — Patient Instructions (Signed)
I recommend checking out the [employer] [place] patient-centered website about fibromyalgia pain: [email or web address].[email or web address]   This condition sometimes benefits from medication but the first line of treatment is usually physical therapy such as exercise and stretching. I do not see any evidence of inflammation to suggest a disease like lupus or rheumatoid arthritis is causing any of the current problems. I do not recommend any specific further testing or medications at this time.   Myofascial Pain Syndrome and Fibromyalgia Myofascial pain syndrome and fibromyalgia are both pain disorders. This pain may be felt mainly in your muscles.  Myofascial pain syndrome: ? Always has tender points in the muscle that will cause pain when pressed (trigger points). The pain may come and go. ? Usually affects your neck, upper back, and shoulder areas. The pain often radiates into your arms and hands.  Fibromyalgia: ? Has muscle pains and tenderness that come and go. ? Is often associated with fatigue and sleep problems. ? Has trigger points. ? Tends to be long-lasting (chronic), but is not life-threatening. Fibromyalgia and myofascial pain syndrome are not the same. However, they often occur together. If you have both conditions, each can make the other worse. Both are common and can cause enough pain and fatigue to make day-to-day activities difficult. Both can be hard to diagnose because their symptoms are common in many other conditions. What are the causes? The exact causes of these conditions are not known. What increases the risk? You are more likely to develop this condition if:  You have a family history of the condition.  You have certain triggers, such as: ? Spine disorders. ? An injury (trauma) or other physical stressors. ? Being under a lot of stress. ? Medical conditions such as osteoarthritis, rheumatoid arthritis, or lupus. What are the signs or symptoms? Fibromyalgia The main  symptom of fibromyalgia is widespread pain and tenderness in your muscles. Pain is sometimes described as stabbing, shooting, or burning. You may also have:  Tingling or numbness.  Sleep problems and fatigue.  Problems with attention and concentration (fibro fog). Other symptoms may include:  Bowel and bladder problems.  Headaches.  Visual problems.  Problems with odors and noises.  Depression or mood changes.  Painful menstrual periods (dysmenorrhea).  Dry skin or eyes. These symptoms can vary over time. Myofascial pain syndrome Symptoms of myofascial pain syndrome include:  Tight, ropy bands of muscle.  Uncomfortable sensations in muscle areas. These may include aching, cramping, burning, numbness, tingling, and weakness.  Difficulty moving certain parts of the body freely (poor range of motion). How is this diagnosed? This condition may be diagnosed by your symptoms and medical history. You will also have a physical exam. In general:  Fibromyalgia is diagnosed if you have pain, fatigue, and other symptoms for more than 3 months, and symptoms cannot be explained by another condition.  Myofascial pain syndrome is diagnosed if you have trigger points in your muscles, and those trigger points are tender and cause pain elsewhere in your body (referred pain). How is this treated? Treatment for these conditions depends on the type that you have.  For fibromyalgia: ? Pain medicines, such as NSAIDs. ? Medicines for treating depression. ? Medicines for treating seizures. ? Medicines that relax the muscles.  For myofascial pain: ? Pain medicines, such as NSAIDs. ? Cooling and stretching of muscles. ? Trigger point injections. ? Sound wave (ultrasound) treatments to stimulate muscles. Treating these conditions often requires a team of health care providers. These may include:  Your primary care provider.  Physical therapist.  Complementary health care providers, such  as massage therapists or acupuncturists.  Psychiatrist for cognitive behavioral therapy. Follow these instructions at home: Medicines  Take over-the-counter and prescription medicines only as told by your health care provider.  Do not drive or use heavy machinery while taking prescription pain medicine.  If you are taking prescription pain medicine, take actions to prevent or treat constipation. Your health care provider may recommend that you: ? Drink enough fluid to keep your urine pale yellow. ? Eat foods that are high in fiber, such as fresh fruits and vegetables, whole grains, and beans. ? Limit foods that are high in fat and processed sugars, such as fried or sweet foods. ? Take an over-the-counter or prescription medicine for constipation. Lifestyle   Exercise as directed by your health care provider or physical therapist.  Practice relaxation techniques to control your stress. You may want to try: ? Biofeedback. ? Visual imagery. ? Hypnosis. ? Muscle relaxation. ? Yoga. ? Meditation.  Maintain a healthy lifestyle. This includes eating a healthy diet and getting enough sleep.  Do not use any products that contain nicotine or tobacco, such as cigarettes and e-cigarettes. If you need help quitting, ask your health care provider. General instructions  Talk to your health care provider about complementary treatments, such as acupuncture or massage.  Consider joining a support group with others who are diagnosed with this condition.  Do not do activities that stress or strain your muscles. This includes repetitive motions and heavy lifting.  Keep all follow-up visits as told by your health care provider. This is important. Where to find more information  National Fibromyalgia Association: www.fmaware.org  Arthritis Foundation: www.arthritis.org  American Chronic Pain Association: www.theacpa.org Contact a health care provider if:  You have new symptoms.  Your  symptoms get worse or your pain is severe.  You have side effects from your medicines.  You have trouble sleeping.  Your condition is causing depression or anxiety. Summary  Myofascial pain syndrome and fibromyalgia are pain disorders.  Myofascial pain syndrome has tender points in the muscle that will cause pain when pressed (trigger points). Fibromyalgia also has muscle pains and tenderness that come and go, but this condition is often associated with fatigue and sleep disturbances.  Fibromyalgia and myofascial pain syndrome are not the same but often occur together, causing pain and fatigue that make day-to-day activities difficult.  Treatment for fibromyalgia includes taking medicines to relax the muscles and medicines for pain, depression, or seizures. Treatment for myofascial pain syndrome includes taking medicines for pain, cooling and stretching of muscles, and injecting medicines into trigger points.  Follow your health care provider's instructions for taking medicines and maintaining a healthy lifestyle. This information is not intended to replace advice given to you by your health care provider. Make sure you discuss any questions you have with your health care provider. Document Revised: 12/27/2018 Document Reviewed: 09/19/2017 Elsevier Patient Education  2020 [employer].

## 2020-08-04 NOTE — Progress Notes (Signed)
Office Visit Note  Patient: Sandy Miller             Date of Birth: 03/30/2001           MRN: [id number]             PCP: Patient, No Pcp Per Referring: Fransisca Connors, MD Visit Date: 08/04/2020  Subjective:  Follow-up   History of Present Illness: Sandy Miller is a 19 y.o. female here for follow up of arthralgias, fatigue, and stiffness with positive ANA. Lab workup after her last visit demonstrated normal inflammatory markers, thyroid function, and RA associated antibodies. She denies any major change in symptoms. Currently her most affect areas are the right side elbow, hand, and knee and some pain in the upper back and shoulder as well.    Review of Systems  Constitutional: Positive for fatigue.  HENT: Negative for mouth sores, mouth dryness and nose dryness.   Eyes: Negative for pain, itching, visual disturbance and dryness.  Respiratory: Negative for cough, shortness of breath and difficulty breathing.   Cardiovascular: Negative for chest pain and palpitations.  Gastrointestinal: Negative for abdominal pain, constipation and diarrhea.  Endocrine: Negative for increased urination.  Genitourinary: Negative for painful urination.  Musculoskeletal: Positive for arthralgias, joint pain, joint swelling and morning stiffness. Negative for myalgias, muscle weakness, muscle tenderness and myalgias.  Skin: Negative for color change, rash and redness.  Allergic/Immunologic: Positive for susceptible to infections.  Neurological: Positive for headaches, memory loss and weakness. Negative for dizziness and numbness.  Hematological: Negative for swollen glands.  Psychiatric/Behavioral: Positive for sleep disturbance. Negative for confusion.    PMFS History:  Patient Active Problem List   Diagnosis Date Noted  . Fibromyalgia 08/04/2020  . Positive ANA (antinuclear antibody) 07/21/2020  . Contraceptive patch status 04/07/2019  . Hypothyroidism 02/11/2019  . Vitamin  D deficiency 02/04/2019  . Depression with anxiety 04/04/2016  . Stress at home 04/04/2016  . Numbness in both hands 02/10/2014  . Pallor of extremity 02/10/2014  . Melasma 08/21/2013  . Keloid of skin 08/21/2013    Past Medical History:  Diagnosis Date  . Anxiety   . Elevated TSH    [facility] Obtained Result of TSH 11 11/07/18 (results faxed to [facility])  . History of depression     Family History  Problem Relation Age of Onset  . Other Father        Numbness of Unknown Etiology  . Mental illness Father   . Arthritis Father   . Stomach cancer Maternal Grandfather        Died at 84  . Mental illness Mother        Bipolar  . Asthma Brother   . Arthritis Paternal Grandfather   . Multiple sclerosis Paternal Aunt    Past Surgical History:  Procedure Laterality Date  . WISDOM TOOTH EXTRACTION  2017   Per patient   Social History   Social History Narrative   Lives with parents and younger brothers      No smokers      Graduated from high school    Immunization History  Administered Date(s) Administered  . DTaP 04/26/2000, 07/03/2000, 10/02/2000, 05/21/2001, 01/19/2005  . HPV Quadrivalent 05/07/2013, 04/01/2014, 04/30/2015  . Hepatitis A 03/05/2006, 02/11/2008  . Hepatitis B 12-May-2000, 10/02/2000, 02/25/2001  . HiB (PRP-OMP) 04/26/2000, 07/03/2000, 10/02/2000, 05/21/2001  . IPV 04/26/2000, 07/03/2000, 10/02/2000, 01/19/2005  . MMR 02/25/2001, 01/19/2005  . Meningococcal B Recombinant 05/02/2016, 09/13/2017  .  Meningococcal Conjugate 04/01/2014, 05/02/2016  . Moderna SARS-COVID-2 Vaccination 02/04/2020, 02/18/2020  . Pneumococcal Conjugate-13 04/26/2000, 07/03/2000, 10/02/2000  . Tdap 04/24/2012  . Varicella 05/21/2001, 03/05/2006     Objective: Vital Signs: BP 109/63 (BP Location: Left Arm, Patient Position: Sitting, Cuff Size: Small)   Pulse 92   Ht _0  (1.575 m)   Wt 119 lb (54 kg)   BMI 21.77 kg/m    Physical Exam Eyes:      Conjunctiva/sclera: Conjunctivae normal.  Skin:    General: Skin is warm and dry.     Findings: No rash.  Neurological:     Mental Status: She is alert.     Comments: Right knee jerk reflex 1+/4 left 2+/4  Psychiatric:     Comments: Flat affect     Musculoskeletal Exam:  Neck full range of motion Shoulder, elbow, wrist, fingers full range of motion Anterior shoulder, elbow, and right hand fingers tenderness without swelling or erythema Paraspinal tenderness to palpation over upper back and above scapulae Normal hip internal and external rotation without pain Knees, ankles full ROM  No obvious joint effusion, synovial hypertrophy, or color enhancement noted on limited US exam  Investigation: No additional findings.  Imaging: No results found.  Recent Labs: Lab Results  Component Value Date   WBC 7.2 12/30/2019   HGB 13.2 12/30/2019   PLT 268 12/30/2019   NA 135 10/10/2013   K 4.1 10/10/2013   CL 99 10/10/2013   CO2 25 10/10/2013   GLUCOSE 78 10/10/2013   BUN 9 10/10/2013   CREATININE 0.87 10/10/2013   CALCIUM 9.8 10/10/2013    Speciality Comments: No specialty comments available.  Procedures:  No procedures performed Allergies: Patient has no known allergies.   Assessment / Plan:     Visit Diagnoses: Positive ANA (antinuclear antibody)  No evidence of joint inflammation on exam or imaging and serum inflammatory labs normal. She does not show clinical criteria of systemic autoimmune disease. Based on this her low positive ANA appears to not correlate with any active disease. With negative ENA panel I think preclinical disease is also unlikely. ANA elevation can be incidental in siginificant percentage of patients with any autoimmune thyroid disease so might be the case here. No additional workup or treatment indicated at this time.  Fibromyalgia  She does have myofascial pain with tenderness especially in the right arm and upper back. She does not have features  of IBS but does have fatigue and headaches along with the diffuse pain so overall could represent FMS. She has a history of depressed mood which can worsening fibromyalgia symptoms. I recommended reviewing Daryll Brod online resources for this as first line treatments are self directed with exercise. Pharmacologic treatment could be a future option such as SNRI.  Hypothyroidism, unspecified type  She is fatigued but normal TSH suggests hormone replacement now at normal level since change in how she was taking synthroid.  Orders: No orders of the defined types were placed in this encounter.  No orders of the defined types were placed in this encounter.   Follow-Up Instructions: Return if symptoms worsen.   Collier Salina, MD  Note - This record has been created using [employer].  Chart creation errors have been sought, but may not always  have been located. Such creation errors do not reflect on  the standard of medical care.

## 2020-08-09 ENCOUNTER — Telehealth: Payer: Self-pay | Admitting: Licensed Clinical Social Worker

## 2020-08-09 ENCOUNTER — Ambulatory Visit (INDEPENDENT_AMBULATORY_CARE_PROVIDER_SITE_OTHER): Payer: Medicaid Other | Admitting: Licensed Clinical Social Worker

## 2020-08-09 ENCOUNTER — Other Ambulatory Visit: Payer: Self-pay

## 2020-08-09 DIAGNOSIS — F4324 Adjustment disorder with disturbance of conduct: Secondary | ICD-10-CM

## 2020-08-09 NOTE — Telephone Encounter (Signed)
I called pt's Mother with Pt's permission to discuss transition of care to an Adult PCP.  Mom reports that she can help the Patient get set up at her primary care practice in [place] and they also have a psychiatrist and psychologist in that practice she could work with for counseling. Mom will reach out if she runs into any barriers.
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
## 2020-09-01 ENCOUNTER — Ambulatory Visit: Payer: Medicaid Other | Admitting: Licensed Clinical Social Worker

## 2020-10-06 DIAGNOSIS — E559 Vitamin D deficiency, unspecified: Secondary | ICD-10-CM | POA: Diagnosis not present

## 2020-10-06 DIAGNOSIS — E039 Hypothyroidism, unspecified: Secondary | ICD-10-CM | POA: Diagnosis not present

## 2020-10-07 LAB — VITAMIN D 25 HYDROXY (VIT D DEFICIENCY, FRACTURES): Vit D, 25-Hydroxy: 23.8 ng/mL — ABNORMAL LOW (ref 30.0–100.0)

## 2020-10-07 LAB — T4, FREE: Free T4: 1.57 ng/dL (ref 0.82–1.77)

## 2020-10-07 LAB — TSH: TSH: 2.26 u[IU]/mL (ref 0.450–4.500)

## 2020-10-12 ENCOUNTER — Encounter: Payer: Self-pay | Admitting: Nurse Practitioner

## 2020-10-12 ENCOUNTER — Ambulatory Visit (INDEPENDENT_AMBULATORY_CARE_PROVIDER_SITE_OTHER): Payer: Medicaid Other | Admitting: Nurse Practitioner

## 2020-10-12 ENCOUNTER — Other Ambulatory Visit: Payer: Self-pay

## 2020-10-12 VITALS — BP 141/81 | HR 101 | Ht 62.0 in | Wt 119.8 lb

## 2020-10-12 DIAGNOSIS — E039 Hypothyroidism, unspecified: Secondary | ICD-10-CM

## 2020-10-12 DIAGNOSIS — E559 Vitamin D deficiency, unspecified: Secondary | ICD-10-CM | POA: Diagnosis not present

## 2020-10-12 MED ORDER — LEVOTHYROXINE SODIUM 75 MCG PO TABS: ORAL_TABLET | ORAL | 1 refills | Status: DC

## 2020-10-12 MED ORDER — VITAMIN D (ERGOCALCIFEROL) 1.25 MG (50000 UNIT) PO CAPS: 50000.0000 [IU] | ORAL_CAPSULE | ORAL | 0 refills | Status: DC

## 2020-10-12 NOTE — Progress Notes (Signed)
10/12/2020, 10:45 AM       Endocrinology Follow Up Visit   TELEHEALTH VISIT: The patient is being engaged in telehealth visit due to COVID-19.  This type of visit limits physical examination significantly, and thus is not preferable over face-to-face encounters.  I connected with  Sandy Miller on 10/12/20 by a video enabled telemedicine application and verified that I am speaking with the correct person using two identifiers.   I discussed the limitations of evaluation and management by telemedicine. The patient expressed understanding and agreed to proceed.    The participants involved in this visit include: Sandy Romp, NP located at [facility] and Sandy Miller  located at their personal residence listed.   Subjective:    Patient ID: Sandy Miller, female    DOB: 04/02/2001, PCP Sandy Connors, MD   Past Medical History:  Diagnosis Date  . Anxiety   . Elevated TSH    [facility] Obtained Result of TSH 11 11/07/18 (results faxed to [facility])  . History of depression    Past Surgical History:  Procedure Laterality Date  . WISDOM TOOTH EXTRACTION  2017   Per patient   Social History   Socioeconomic History  . Marital status: Single    Spouse name: Not on file  . Number of children: Not on file  . Years of education: Not on file  . Highest education level: Not on file  Occupational History  . Not on file  Tobacco Use  . Smoking status: Passive Smoke Exposure - Never Smoker  . Smokeless tobacco: Never Used  Vaping Use  . Vaping Use: Never used  Substance and Sexual Activity  . Alcohol use: No  . Drug use: No  . Sexual activity: Never  Other Topics Concern  . Not on file  Social History Narrative   Lives with parents and younger brothers      No smokers      Graduated from high school    Social Determinants of Health   Financial Resource  Strain: Not on file  Food Insecurity: Not on file  Transportation Needs: Not on file  Physical Activity: Not on file  Stress: Not on file  Social Connections: Not on file   Family History  Problem Relation Age of Onset  . Other Father        Numbness of Unknown Etiology  . Mental illness Father   . Arthritis Father   . Stomach cancer Maternal Grandfather        Died at 34  . Mental illness Mother        Bipolar  . Asthma Brother   . Arthritis Paternal Grandfather   . Multiple sclerosis Paternal Aunt    Outpatient Encounter Medications as of 10/12/2020  Medication Sig  . Vitamin D, Ergocalciferol, (DRISDOL) 1.25 MG (50000 UNIT) CAPS capsule Take 1 capsule (50,000 Units total) by mouth every 7 (seven) days.  Sandy Miller 150-35 MCG/24HR transdermal patch APPLY 1 PATCH TO SKIN ONCE A WEEK AS DIRECTED  . [DISCONTINUED] levothyroxine (SYNTHROID) 75 MCG tablet As directed  . Cholecalciferol (VITAMIN D3 PO) Take by mouth. (Patient not taking: No sig reported)  . ibuprofen (ADVIL)  400 MG tablet Take one tablet every 8 hours as needed for pain and swelling for up to 5 days (Patient not taking: No sig reported)  . levothyroxine (SYNTHROID) 75 MCG tablet As directed  . [DISCONTINUED] D3-50 1.25 MG (50000 UT) capsule Take 1 capsule (50,000 Units total) by mouth once a week. (Patient not taking: No sig reported)   No facility-administered encounter medications on file as of 10/12/2020.   ALLERGIES: No Known Allergies  VACCINATION STATUS: Immunization History  Administered Date(s) Administered  . DTaP 04/26/2000, 07/03/2000, 10/02/2000, 05/21/2001, 01/19/2005  . HPV Quadrivalent 05/07/2013, 04/01/2014, 04/30/2015  . Hepatitis A 03/05/2006, 02/11/2008  . Hepatitis B 21-Jan-2000, 10/02/2000, 02/25/2001  . HiB (PRP-OMP) 04/26/2000, 07/03/2000, 10/02/2000, 05/21/2001  . IPV 04/26/2000, 07/03/2000, 10/02/2000, 01/19/2005  . MMR 02/25/2001, 01/19/2005  . Meningococcal B Recombinant 05/02/2016,  09/13/2017  . Meningococcal Conjugate 04/01/2014, 05/02/2016  . Moderna Sars-Covid-2 Vaccination 02/04/2020, 02/18/2020  . Pneumococcal Conjugate-13 04/26/2000, 07/03/2000, 10/02/2000  . Tdap 04/24/2012  . Varicella 05/21/2001, 03/05/2006    Thyroid Problem Presents for follow-up visit. Symptoms include fatigue, heat intolerance, palpitations and tremors. Patient reports no anxiety, cold intolerance, constipation, depressed mood, diarrhea, menstrual problem, weight gain or weight loss. The symptoms have been worsening.   Sandy Miller is 20 y.o. female who is being seen in follow-up for the management of hypothyroidism, vitamin D deficiency.  She is accompanied by her mother to her appointment today.  She is currently on levothyroxine 75 mcg p.o. daily.  She reports better compliance with the medication, however she still sometimes forgets to take it.   - She reports intermittent palpitations, tremors, fatigue and heat intolerance.  She also reports decreased grip strength in right hand (currently seeing Rheumatology).  - She has distant cousins with various forms of thyroid dysfunction, however no family history of thyroid malignancy.    -She denies palpitations, tremors, heat intolerance.  She is eating better, gaining weight. -She denies dysphagia, shortness of breath, nor voice change.   Review of systems  Constitutional: + Minimally fluctuating body weight,  current Body mass index is 21.91 kg/m. , + fatigue, + subjective hyperthermia, no subjective hypothermia Eyes: no blurry vision, no xerophthalmia ENT: no sore throat, no nodules palpated in throat, no dysphagia/odynophagia, no hoarseness Cardiovascular: no chest pain, no shortness of breath, + intermittent palpitations, no leg swelling Respiratory: no cough, no shortness of breath Gastrointestinal: no nausea/vomiting/diarrhea Musculoskeletal: no muscle/joint aches Skin: no rashes, no hyperemia Neurological: +  intermittent tremors, no numbness, no tingling, no dizziness Psychiatric: no depression, no anxiety   Objective:    BP (!) 141/81 (BP Location: Left Arm, Patient Position: Sitting)   Pulse (!) 101   Ht 5' 2"  (1.575 m)   Wt 119 lb 12.8 oz (54.3 kg)   BMI 21.91 kg/m    Wt Readings from Last 3 Encounters:  10/12/20 119 lb 12.8 oz (54.3 kg)  08/04/20 119 lb (54 kg)  07/21/20 119 lb 3.2 oz (54.1 kg)    BP Readings from Last 3 Encounters:  10/12/20 (!) 141/81  08/04/20 109/63  07/21/20 101/64    Physical Exam- Limited  Constitutional:  Body mass index is 21.91 kg/m. , not in acute distress, flat, unconcerned affect Eyes:  EOMI, no exophthalmos Neck: Supple Thyroid: No gross goiter Cardiovascular: RRR, no murmers, rubs, or gallops, no edema Respiratory: Adequate breathing efforts, no crackles, rales, rhonchi, or wheezing Musculoskeletal: no gross deformities, strength intact in all four extremities, no gross restriction of joint movements Skin:  no rashes, no hyperemia Neurological: no tremor with outstretched hands  CMP     Component Value Date/Time   NA 135 10/10/2013 1239   K 4.1 10/10/2013 1239   CL 99 10/10/2013 1239   CO2 25 10/10/2013 1239   GLUCOSE 78 10/10/2013 1239   BUN 9 10/10/2013 1239   CREATININE 0.87 10/10/2013 1239   CALCIUM 9.8 10/10/2013 1239     Diabetic Labs (most recent): Lab Results  Component Value Date   HGBA1C 5.3 11/07/2018   HGBA1C 5.7 (H) 10/10/2013     Lab Results  Component Value Date   TSH 2.260 10/06/2020   TSH 1.29 07/22/2020   TSH 8.74 (H) 05/28/2020   TSH 7.85 (H) 12/30/2019   TSH 3.69 09/02/2019   TSH 4.58 (H) 05/08/2019   TSH 4.72 (H) 02/04/2019   TSH 11.04 (A) 11/07/2018   FREET4 1.57 10/06/2020   FREET4 1.3 05/28/2020   FREET4 1.2 12/30/2019   FREET4 1.2 09/02/2019   FREET4 1.3 05/08/2019   FREET4 1.1 02/04/2019      Assessment & Plan:   1.  Hypothyroidism: -Her previsit TFTs are consistent with  appropriate hormone replacement.  She is advised to continue Levothyroxine 75 mcg po daily before breakfast and to take it consistently.  Her symptoms are nonspecific and can be associated with any number of different ailments, however it can also be attributed to inconsistency with taking her thyroid hormone. Therefore, it is imperative that she be consistent with taking her medication to help identify possible etiology.   - We discussed about the correct intake of her thyroid hormone, on empty stomach at fasting, with water, separated by at least 30 minutes from breakfast and other medications,  and separated by more than 4 hours from calcium, iron, multivitamins, acid reflux medications (PPIs). -Patient is made aware of the fact that thyroid hormone replacement is needed for life, dose to be adjusted by periodic monitoring of thyroid function tests.  She is informed to notify clinic if she confirms or plans pregnancy, that she will need a higher dose during the first trimester.    2.  Vitamin D deficiency: Her recent vitamin D level from 10/06/20 was 23.8.  She has not been taking her vitamin D supplement.  I discussed and initiated Ergocalciferol 50,000 units weekly x 12 weeks (and advised her to take it consistently).   - I advised her to maintain close follow up with Sandy Connors, MD for primary care needs.      - Time spent on this patient care encounter:  20 minutes of which 50% was spent in  counseling and the rest reviewing  her current and  previous labs / studies and medications  doses and developing a plan for long term care. Sandy Miller  participated in the discussions, expressed understanding, and voiced agreement with the above plans.  All questions were answered to her satisfaction. she is encouraged to contact clinic should she have any questions or concerns prior to her return visit.    Follow up plan: Return in about 4 months (around 02/09/2021) for Thyroid  follow up, Previsit labs.   Sandy Miller, [facility] [address] [place], [place] [address] Phone: [phone] Fax: [phone]   10/12/2020, 10:45 AM

## 2020-10-12 NOTE — Patient Instructions (Signed)

## 2020-10-18 DIAGNOSIS — F4320 Adjustment disorder, unspecified: Secondary | ICD-10-CM | POA: Diagnosis not present

## 2020-10-18 DIAGNOSIS — Z0000 Encounter for general adult medical examination without abnormal findings: Secondary | ICD-10-CM | POA: Diagnosis not present

## 2020-12-28 ENCOUNTER — Other Ambulatory Visit: Payer: Self-pay

## 2020-12-28 ENCOUNTER — Ambulatory Visit (INDEPENDENT_AMBULATORY_CARE_PROVIDER_SITE_OTHER): Payer: Medicaid Other | Admitting: Internal Medicine

## 2020-12-28 ENCOUNTER — Encounter: Payer: Self-pay | Admitting: Internal Medicine

## 2020-12-28 VITALS — BP 114/71 | HR 79 | Ht 62.0 in | Wt 113.0 lb

## 2020-12-28 DIAGNOSIS — M25571 Pain in right ankle and joints of right foot: Secondary | ICD-10-CM | POA: Insufficient documentation

## 2020-12-28 NOTE — Progress Notes (Signed)
Office Visit Note  Patient: Sandy Miller             Date of Birth: 2001/04/20           MRN: [id number]             PCP: Pcp, No Referring: No ref. provider found Visit Date: 12/28/2020   Subjective:  Follow-up (Patient complains of worsening symptoms in the last few months. )   History of Present Illness: Sandy Miller is a 20 y.o. female here for follow up for fatigue, joint pains, and positive ANA. At initial visits no inflammatory changes were seen. Since then she has increased problems particularly with the right leg and ankle. Pain is getting worse during the day with walking and weightbearing and localizes to the heel and lateral side of the foot. She does not notice much swelling or discoloration. She does not have generalized change in joint pain outside of this. Symptoms are slightly responsive to OTC analgesics.    Review of Systems  Constitutional: Positive for fatigue.  HENT: Negative for mouth sores, mouth dryness and nose dryness.   Eyes: Positive for visual disturbance. Negative for pain, itching and dryness.  Respiratory: Positive for shortness of breath and difficulty breathing. Negative for cough and hemoptysis.   Cardiovascular: Positive for chest pain. Negative for palpitations and swelling in legs/feet.  Gastrointestinal: Negative for abdominal pain, blood in stool, constipation and diarrhea.  Endocrine: Negative for increased urination.  Genitourinary: Negative for painful urination.  Musculoskeletal: Positive for arthralgias, joint pain, myalgias, muscle weakness, morning stiffness, muscle tenderness and myalgias. Negative for joint swelling.  Skin: Negative for color change, rash and redness.  Allergic/Immunologic: Negative for susceptible to infections.  Neurological: Positive for dizziness, headaches, memory loss and weakness. Negative for numbness.  Hematological: Negative for swollen glands.  Psychiatric/Behavioral: Positive for confusion  and sleep disturbance.    PMFS History:  Patient Active Problem List   Diagnosis Date Noted  . Pain in right ankle and joints of right foot 12/28/2020  . Fibromyalgia 08/04/2020  . Positive ANA (antinuclear antibody) 07/21/2020  . Contraceptive patch status 04/07/2019  . Hypothyroidism 02/11/2019  . Vitamin D deficiency 02/04/2019  . Depression with anxiety 04/04/2016  . Stress at home 04/04/2016  . Numbness in both hands 02/10/2014  . Pallor of extremity 02/10/2014  . Melasma 08/21/2013  . Keloid of skin 08/21/2013    Past Medical History:  Diagnosis Date  . Anxiety   . Elevated TSH    [facility] Obtained Result of TSH 11 11/07/18 (results faxed to [facility])  . History of depression     Family History  Problem Relation Age of Onset  . Other Father        Numbness of Unknown Etiology  . Mental illness Father   . Arthritis Father   . Stomach cancer Maternal Grandfather        Died at 6  . Mental illness Mother        Bipolar  . Asthma Brother   . Arthritis Paternal Grandfather   . Multiple sclerosis Paternal Aunt    Past Surgical History:  Procedure Laterality Date  . WISDOM TOOTH EXTRACTION  2017   Per patient   Social History   Social History Narrative   Lives with parents and younger brothers      No smokers      Graduated from high school    Immunization History  Administered Date(s) Administered  .  DTaP 04/26/2000, 07/03/2000, 10/02/2000, 05/21/2001, 01/19/2005  . HPV Quadrivalent 05/07/2013, 04/01/2014, 04/30/2015  . Hepatitis A 03/05/2006, 02/11/2008  . Hepatitis B Sep 02, 2000, 10/02/2000, 02/25/2001  . HiB (PRP-OMP) 04/26/2000, 07/03/2000, 10/02/2000, 05/21/2001  . IPV 04/26/2000, 07/03/2000, 10/02/2000, 01/19/2005  . MMR 02/25/2001, 01/19/2005  . Meningococcal B Recombinant 05/02/2016, 09/13/2017  . Meningococcal Conjugate 04/01/2014, 05/02/2016  . Moderna Sars-Covid-2 Vaccination 02/04/2020, 02/18/2020  . Pneumococcal Conjugate-13  04/26/2000, 07/03/2000, 10/02/2000  . Tdap 04/24/2012  . Varicella 05/21/2001, 03/05/2006     Objective: Vital Signs: BP 114/71 (BP Location: Left Arm, Patient Position: Sitting, Cuff Size: Normal)   Pulse 79   Ht 5' 2"  (1.575 m)   Wt 113 lb (51.3 kg)   BMI 20.67 kg/m    Physical Exam Skin:    General: Skin is warm and dry.     Findings: No rash.  Neurological:     Mental Status: She is alert.  Psychiatric:     Comments: Flat affect     Musculoskeletal Exam:  No leg length discrepency Knees full ROM no tenderness or swelling Ankles right ankle with decreased dorsiflexion ROM about 15 degrees less than left ankle, no swelling, mild tenderness over lateral ankle distally No plantar fascia tenderness at calcaneus MTPs full ROM no tenderness or swelling  Investigation: No additional findings.  Imaging: No results found.  Recent Labs: Lab Results  Component Value Date   WBC 7.2 12/30/2019   HGB 13.2 12/30/2019   PLT 268 12/30/2019   NA 135 10/10/2013   K 4.1 10/10/2013   CL 99 10/10/2013   CO2 25 10/10/2013   GLUCOSE 78 10/10/2013   BUN 9 10/10/2013   CREATININE 0.87 10/10/2013   CALCIUM 9.8 10/10/2013    Speciality Comments: No specialty comments available.  Procedures:  No procedures performed Allergies: Patient has no known allergies.   Assessment / Plan:     Visit Diagnoses: Pain in right ankle and joints of right foot - Plan: Ambulatory referral to Podiatry   Right ankle ROM is restricted I suspect maybe posterior tibial tendon dysfunction or else lateral ankle tendons or enthesis. I do not see any effusion or swelling in the tibiotalar joint and pain is very focal over superficial areas. She may benefit with trial of orthotics or other therapy so will refer for podiatry evaluation.  Orders: Orders Placed This Encounter  Procedures  . Ambulatory referral to Podiatry   No orders of the defined types were placed in this encounter.    Follow-Up  Instructions: Return if symptoms worsen or fail to improve.   Collier Salina, MD  Note - This record has been created using [employer].  Chart creation errors have been sought, but may not always  have been located. Such creation errors do not reflect on  the standard of medical care.

## 2021-01-05 ENCOUNTER — Ambulatory Visit (INDEPENDENT_AMBULATORY_CARE_PROVIDER_SITE_OTHER): Payer: Medicaid Other

## 2021-01-05 ENCOUNTER — Ambulatory Visit (INDEPENDENT_AMBULATORY_CARE_PROVIDER_SITE_OTHER): Payer: Medicaid Other | Admitting: Podiatry

## 2021-01-05 ENCOUNTER — Other Ambulatory Visit: Payer: Self-pay

## 2021-01-05 ENCOUNTER — Other Ambulatory Visit: Payer: Self-pay | Admitting: Podiatry

## 2021-01-05 DIAGNOSIS — Q6689 Other  specified congenital deformities of feet: Secondary | ICD-10-CM

## 2021-01-05 DIAGNOSIS — M25571 Pain in right ankle and joints of right foot: Secondary | ICD-10-CM | POA: Diagnosis not present

## 2021-01-05 DIAGNOSIS — M79671 Pain in right foot: Secondary | ICD-10-CM

## 2021-01-05 NOTE — Progress Notes (Signed)
   HPI: 20 y.o. female presenting today as a new patient for evaluation of right foot and ankle pain.  Patient states that she was seen by previous provider who explained that she had no motion in her right foot and ankle.  She has been experiencing right foot and ankle pain for several years now.  She denies a history of injury or trauma.  She presents for further treatment and evaluation  Past Medical History:  Diagnosis Date  . Anxiety   . Elevated TSH    [facility] Obtained Result of TSH 11 11/07/18 (results faxed to [facility])  . History of depression      Physical Exam: General: The patient is alert and oriented x3 in no acute distress.  Dermatology: Skin is warm, dry and supple bilateral lower extremities. Negative for open lesions or macerations.  Vascular: Palpable pedal pulses bilaterally. No edema or erythema noted. Capillary refill within normal limits.  Neurological: Epicritic and protective threshold grossly intact bilaterally.   Musculoskeletal Exam:  Muscle strength 5/5 in all groups bilateral.  Limited range of motion with ankle joint dorsiflexion and subtalar joint inversion and eversion noted right foot and ankle.  There is a tight gastrocnemius equinus also noted.  Positive Silfverskiold test.  Radiographic Exam:  Normal osseous mineralization. Joint spaces preserved. No fracture/dislocation/boney destruction.  There may be some evidence of a tarsal coalition possible middle facet on the different views.  Additional imaging needed  Assessment: 1.  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 Patient evaluated. X-Rays reviewed.  2.  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 3.  MRI ordered right ankle to rule out tarsal coalition 4.  Return to clinic after MRI to review results and discuss different treatment options      Felecia Shelling, DPM [facility]  Dr. Felecia Shelling, DPM    [address] [place], [place] [address]                Office [phone]  Fax [phone]

## 2021-01-12 ENCOUNTER — Other Ambulatory Visit: Payer: Self-pay

## 2021-01-12 ENCOUNTER — Telehealth: Payer: Self-pay

## 2021-01-12 DIAGNOSIS — E559 Vitamin D deficiency, unspecified: Secondary | ICD-10-CM

## 2021-01-12 MED ORDER — VITAMIN D-3 125 MCG (5000 UT) PO TABS: 1.0000 | ORAL_TABLET | Freq: Every day | ORAL | 3 refills | Status: DC

## 2021-01-12 NOTE — Telephone Encounter (Signed)
Patient can not take the gel capsules of the Vitamin D, can this be switched to pill form? [facility]

## 2021-01-12 NOTE — Telephone Encounter (Signed)
Spoke with patient, she wants to take the daily vitamin d, sent in updated script to pharmacy

## 2021-01-12 NOTE — Telephone Encounter (Signed)
I don't think they have a tab but they do have a liquid I think. Would she rather we change to that?

## 2021-01-12 NOTE — Telephone Encounter (Signed)
Please advise 

## 2021-01-12 NOTE — Telephone Encounter (Signed)
I called [facility], they do not have that strength in tablet nor in liquid, just the OTC strength

## 2021-01-12 NOTE — Telephone Encounter (Signed)
Well, if she cannot take the prescription strength capsules, she can switch to OTC vitamin D3 5000 units daily.

## 2021-01-18 ENCOUNTER — Telehealth: Payer: Self-pay | Admitting: Podiatry

## 2021-01-18 NOTE — Telephone Encounter (Signed)
Pre certification is needed for the following patient, is needed by 1 pm today in order for patient to have MRI completed, Please Advise   Kendra [phone] Ext. [phone]

## 2021-01-20 ENCOUNTER — Ambulatory Visit (HOSPITAL_COMMUNITY): Payer: Medicaid Other

## 2021-01-27 DIAGNOSIS — E559 Vitamin D deficiency, unspecified: Secondary | ICD-10-CM | POA: Diagnosis not present

## 2021-01-27 DIAGNOSIS — E039 Hypothyroidism, unspecified: Secondary | ICD-10-CM | POA: Diagnosis not present

## 2021-01-28 LAB — VITAMIN D 25 HYDROXY (VIT D DEFICIENCY, FRACTURES): Vit D, 25-Hydroxy: 48 ng/mL (ref 30.0–100.0)

## 2021-01-28 LAB — TSH: TSH: 9.46 u[IU]/mL — ABNORMAL HIGH (ref 0.450–4.500)

## 2021-01-28 LAB — T4, FREE: Free T4: 1.31 ng/dL (ref 0.82–1.77)

## 2021-02-09 ENCOUNTER — Ambulatory Visit: Payer: Medicaid Other | Admitting: Nurse Practitioner

## 2021-02-09 ENCOUNTER — Other Ambulatory Visit: Payer: Self-pay

## 2021-02-09 ENCOUNTER — Ambulatory Visit (INDEPENDENT_AMBULATORY_CARE_PROVIDER_SITE_OTHER): Payer: Medicaid Other | Admitting: Nurse Practitioner

## 2021-02-09 ENCOUNTER — Encounter: Payer: Self-pay | Admitting: Nurse Practitioner

## 2021-02-09 ENCOUNTER — Telehealth: Payer: Self-pay | Admitting: Podiatry

## 2021-02-09 VITALS — BP 116/74 | HR 82 | Ht 62.0 in | Wt 116.0 lb

## 2021-02-09 DIAGNOSIS — E039 Hypothyroidism, unspecified: Secondary | ICD-10-CM | POA: Diagnosis not present

## 2021-02-09 NOTE — Patient Instructions (Signed)

## 2021-02-09 NOTE — Telephone Encounter (Signed)
Pre-service center calling to request authorization for MRI of right ankle without contrast. Needs authorization by 1pm today.

## 2021-02-09 NOTE — Progress Notes (Signed)
02/09/2021, 11:30 AM       Endocrinology Follow Up Visit     Subjective:    Patient ID: Sandy Miller, female    DOB: 24-Sep-2000, PCP Abran Richard, MD   Past Medical History:  Diagnosis Date  . Anxiety   . Elevated TSH    [facility] Obtained Result of TSH 11 11/07/18 (results faxed to [facility])  . History of depression    Past Surgical History:  Procedure Laterality Date  . WISDOM TOOTH EXTRACTION  2017   Per patient   Social History   Socioeconomic History  . Marital status: Single    Spouse name: Not on file  . Number of children: Not on file  . Years of education: Not on file  . Highest education level: Not on file  Occupational History  . Not on file  Tobacco Use  . Smoking status: Passive Smoke Exposure - Never Smoker  . Smokeless tobacco: Never Used  Vaping Use  . Vaping Use: Never used  Substance and Sexual Activity  . Alcohol use: No  . Drug use: No  . Sexual activity: Never  Other Topics Concern  . Not on file  Social History Narrative   Lives with parents and younger brothers      No smokers      Graduated from high school    Social Determinants of Health   Financial Resource Strain: Not on file  Food Insecurity: Not on file  Transportation Needs: Not on file  Physical Activity: Not on file  Stress: Not on file  Social Connections: Not on file   Family History  Problem Relation Age of Onset  . Other Father        Numbness of Unknown Etiology  . Mental illness Father   . Arthritis Father   . Stomach cancer Maternal Grandfather        Died at 7  . Mental illness Mother        Bipolar  . Asthma Brother   . Arthritis Paternal Grandfather   . Multiple sclerosis Paternal Aunt    Outpatient Encounter Medications as of 02/09/2021  Medication Sig  . Cholecalciferol (VITAMIN D-3) 125 MCG (5000 UT) TABS Take 1 tablet by mouth daily.  Marland Kitchen ibuprofen (ADVIL) 400 MG tablet  Take one tablet every 8 hours as needed for pain and swelling for up to 5 days (Patient not taking: No sig reported)  . levothyroxine (SYNTHROID) 75 MCG tablet As directed  . XULANE 150-35 MCG/24HR transdermal patch APPLY 1 PATCH TO SKIN ONCE A WEEK AS DIRECTED   No facility-administered encounter medications on file as of 02/09/2021.   ALLERGIES: No Known Allergies  VACCINATION STATUS: Immunization History  Administered Date(s) Administered  . DTaP 04/26/2000, 07/03/2000, 10/02/2000, 05/21/2001, 01/19/2005  . HPV Quadrivalent 05/07/2013, 04/01/2014, 04/30/2015  . Hepatitis A 03/05/2006, 02/11/2008  . Hepatitis B 2000-03-22, 10/02/2000, 02/25/2001  . HiB (PRP-OMP) 04/26/2000, 07/03/2000, 10/02/2000, 05/21/2001  . IPV 04/26/2000, 07/03/2000, 10/02/2000, 01/19/2005  . MMR 02/25/2001, 01/19/2005  . Meningococcal B Recombinant 05/02/2016, 09/13/2017  . Meningococcal Conjugate 04/01/2014, 05/02/2016  . Moderna Sars-Covid-2 Vaccination 02/04/2020, 02/18/2020  . Pneumococcal Conjugate-13 04/26/2000, 07/03/2000, 10/02/2000  . Tdap 04/24/2012  . Varicella  05/21/2001, 03/05/2006    Thyroid Problem Presents for follow-up visit. Symptoms include fatigue, heat intolerance, palpitations and tremors. Patient reports no anxiety, cold intolerance, constipation, depressed mood, diarrhea, menstrual problem, weight gain or weight loss. The symptoms have been worsening.   Sandy Miller is 20 y.o. female who is being seen in follow-up for the management of hypothyroidism, vitamin D deficiency.    She is currently on levothyroxine 75 mcg p.o. daily.  She reports better compliance with the medication, however she still sometimes forgets to take it.   -She also reports decreased grip strength in right hand (currently seeing Rheumatology).  Was recently diagnosed with osteoarthritis.  - She has distant cousins with various forms of thyroid dysfunction, however no family history of thyroid malignancy.     -She denies palpitations, tremors, heat intolerance.  She is eating better, gaining weight. -She denies dysphagia, shortness of breath, nor voice change.   Review of systems  Constitutional: + Minimally fluctuating body weight,  current Body mass index is 21.22 kg/m. , + fatigue, no subjective hyperthermia, no subjective hypothermia Eyes: no blurry vision, no xerophthalmia ENT: no sore throat, no nodules palpated in throat, no dysphagia/odynophagia, no hoarseness Cardiovascular: no chest pain, no shortness of breath, no intermittent palpitations, no leg swelling Respiratory: no cough, no shortness of breath Gastrointestinal: no nausea/vomiting/diarrhea Musculoskeletal: no muscle/joint aches Skin: no rashes, no hyperemia Neurological: no intermittent tremors, no numbness, no tingling, no dizziness Psychiatric: no depression, no anxiety   Objective:    BP 116/74   Pulse 82   Ht 5' 2"  (1.575 m)   Wt 116 lb (52.6 kg)   BMI 21.22 kg/m    Wt Readings from Last 3 Encounters:  02/09/21 116 lb (52.6 kg)  12/28/20 113 lb (51.3 kg)  10/12/20 119 lb 12.8 oz (54.3 kg)    BP Readings from Last 3 Encounters:  02/09/21 116/74  12/28/20 114/71  10/12/20 (!) 141/81     Physical Exam- Limited  Constitutional:  Body mass index is 21.22 kg/m. , not in acute distress, normal state of mind Eyes:  EOMI, no exophthalmos Neck: Supple Cardiovascular: RRR, no murmurs, rubs, or gallops, no edema Respiratory: Adequate breathing efforts, no crackles, rales, rhonchi, or wheezing Musculoskeletal: no gross deformities, strength intact in all four extremities, no gross restriction of joint movements Skin:  no rashes, no hyperemia Neurological: no tremor with outstretched hands    CMP     Component Value Date/Time   NA 135 10/10/2013 1239   K 4.1 10/10/2013 1239   CL 99 10/10/2013 1239   CO2 25 10/10/2013 1239   GLUCOSE 78 10/10/2013 1239   BUN 9 10/10/2013 1239   CREATININE 0.87  10/10/2013 1239   CALCIUM 9.8 10/10/2013 1239     Diabetic Labs (most recent): Lab Results  Component Value Date   HGBA1C 5.3 11/07/2018   HGBA1C 5.7 (H) 10/10/2013     Lab Results  Component Value Date   TSH 9.460 (H) 01/27/2021   TSH 2.260 10/06/2020   TSH 1.29 07/22/2020   TSH 8.74 (H) 05/28/2020   TSH 7.85 (H) 12/30/2019   TSH 3.69 09/02/2019   TSH 4.58 (H) 05/08/2019   TSH 4.72 (H) 02/04/2019   TSH 11.04 (A) 11/07/2018   FREET4 1.31 01/27/2021   FREET4 1.57 10/06/2020   FREET4 1.3 05/28/2020   FREET4 1.2 12/30/2019   FREET4 1.2 09/02/2019   FREET4 1.3 05/08/2019   FREET4 1.1 02/04/2019      Assessment & Plan:  1.  Hypothyroidism:  -Her previsit TFTs are consistent with inappropriate hormone replacement.  However, based on her tendency to forget doses, she is advised to continue Levothyroxine 75 mcg po daily before breakfast and to take it consistently.    - We discussed about the correct intake of her thyroid hormone, on empty stomach at fasting, with water, separated by at least 30 minutes from breakfast and other medications,  and separated by more than 4 hours from calcium, iron, multivitamins, acid reflux medications (PPIs). -Patient is made aware of the fact that thyroid hormone replacement is needed for life, dose to be adjusted by periodic monitoring of thyroid function tests.  She is informed to notify clinic if she confirms or plans pregnancy, that she will need a higher dose during the first trimester.    2.  Vitamin D deficiency: Her recent vitamin D level from 01/27/21 was 48.  She is still finishing her replenishment with Ergocalciferol and can discontinue supplementation afterwards.   - I advised her to maintain close follow up with Abran Richard, MD for primary care needs.     I spent 20 minutes in the care of the patient today including review of labs from Thyroid Function, CMP, and other relevant labs ; imaging/biopsy records (current and  previous including abstractions from other facilities); face-to-face time discussing  her lab results and symptoms, medications doses, her options of short and long term treatment based on the latest standards of care / guidelines;   and documenting the encounter.  Sandy Miller  participated in the discussions, expressed understanding, and voiced agreement with the above plans.  All questions were answered to her satisfaction. she is encouraged to contact clinic should she have any questions or concerns prior to her return visit.   Follow up plan: Return in about 4 months (around 06/12/2021) for Thyroid follow up, Previsit labs.   Rayetta Pigg, [facility] [address] [place], [place] [address] Phone: [phone] Fax: [phone]   02/09/2021, 11:30 AM

## 2021-02-10 ENCOUNTER — Telehealth: Payer: Self-pay | Admitting: Podiatry

## 2021-02-10 NOTE — Telephone Encounter (Signed)
Patients mom called stated that need her daughters MRI needed to be sent over to [facility]. Insurance is requesting a prior authorization. Patients appointment has been cx 2 times and she needs to be scheduled asap.

## 2021-02-11 ENCOUNTER — Ambulatory Visit (HOSPITAL_COMMUNITY): Admission: RE | Admit: 2021-02-11 | Payer: Medicaid Other | Source: Ambulatory Visit

## 2021-02-15 NOTE — Telephone Encounter (Signed)
Patients mom has called back about the referral for her daughters [facility] to [facility]. She also needs to have the prior auth done as well. Please advise

## 2021-02-18 ENCOUNTER — Telehealth: Payer: Self-pay

## 2021-02-23 NOTE — Telephone Encounter (Signed)
Pt's mother called requesting an update on her daughter's prior authorization. I told her you or I would give her a call once we have an update to give. Please advise.

## 2021-02-25 NOTE — Telephone Encounter (Signed)
Laverle Hobby!  Is there any update on this patient's MRI order?  It says that it scheduled on 03/03/2021 at [facility], but I am not sure if there is an authorization number that is required?  Thanks!!!

## 2021-03-01 ENCOUNTER — Telehealth: Payer: Self-pay | Admitting: *Deleted

## 2021-03-01 NOTE — Telephone Encounter (Addendum)
[employer] has been approved by Lauris Poag., (Ref # [id number])-06/14-08/13,if  patient has to rescheduled after that date , will need to fax update to [phone],never received the original fax.Patient may have a $3.00 payment at time of service.  Called and left vmessage w/ [facility] cert [facility] [phone],EXH. [id number] [employer] is calling to request office notes and xrays for patient. Faxed , received successful confirmation to [facility], urgent request-03/02/21.

## 2021-03-03 ENCOUNTER — Ambulatory Visit (HOSPITAL_COMMUNITY): Admission: RE | Admit: 2021-03-03 | Payer: Medicaid Other | Source: Ambulatory Visit

## 2021-03-03 ENCOUNTER — Encounter (HOSPITAL_COMMUNITY): Payer: Self-pay

## 2021-03-16 ENCOUNTER — Ambulatory Visit (HOSPITAL_COMMUNITY)
Admission: RE | Admit: 2021-03-16 | Discharge: 2021-03-16 | Disposition: A | Discharge status: Home / Self Care | Payer: Medicaid Other | Source: Ambulatory Visit | Attending: Podiatry | Admitting: Podiatry

## 2021-03-16 ENCOUNTER — Other Ambulatory Visit: Payer: Self-pay

## 2021-03-16 DIAGNOSIS — Q6689 Other  specified congenital deformities of feet: Secondary | ICD-10-CM | POA: Diagnosis not present

## 2021-03-16 DIAGNOSIS — M25571 Pain in right ankle and joints of right foot: Secondary | ICD-10-CM | POA: Diagnosis not present

## 2021-03-16 DIAGNOSIS — M79671 Pain in right foot: Secondary | ICD-10-CM | POA: Insufficient documentation

## 2021-03-16 DIAGNOSIS — M25671 Stiffness of right ankle, not elsewhere classified: Secondary | ICD-10-CM | POA: Diagnosis not present

## 2021-03-28 ENCOUNTER — Encounter: Payer: Self-pay | Admitting: Pediatrics

## 2021-05-26 DIAGNOSIS — E039 Hypothyroidism, unspecified: Secondary | ICD-10-CM | POA: Diagnosis not present

## 2021-05-27 LAB — TSH: TSH: 4.95 u[IU]/mL — ABNORMAL HIGH (ref 0.450–4.500)

## 2021-05-27 LAB — T4, FREE: Free T4: 1.3 ng/dL (ref 0.82–1.77)

## 2021-06-12 ENCOUNTER — Encounter (HOSPITAL_COMMUNITY): Payer: Self-pay | Admitting: *Deleted

## 2021-06-12 ENCOUNTER — Emergency Department (HOSPITAL_COMMUNITY)
Admission: EM | Admit: 2021-06-12 | Discharge: 2021-06-12 | Disposition: A | Discharge status: Home / Self Care | Payer: Medicaid Other | Attending: Emergency Medicine | Admitting: Emergency Medicine

## 2021-06-12 ENCOUNTER — Other Ambulatory Visit: Payer: Self-pay

## 2021-06-12 DIAGNOSIS — J029 Acute pharyngitis, unspecified: Secondary | ICD-10-CM | POA: Diagnosis present

## 2021-06-12 DIAGNOSIS — Z7722 Contact with and (suspected) exposure to environmental tobacco smoke (acute) (chronic): Secondary | ICD-10-CM | POA: Insufficient documentation

## 2021-06-12 DIAGNOSIS — Z79899 Other long term (current) drug therapy: Secondary | ICD-10-CM | POA: Diagnosis not present

## 2021-06-12 DIAGNOSIS — U071 COVID-19: Secondary | ICD-10-CM | POA: Diagnosis not present

## 2021-06-12 DIAGNOSIS — E039 Hypothyroidism, unspecified: Secondary | ICD-10-CM | POA: Diagnosis not present

## 2021-06-12 MED ORDER — DM-GUAIFENESIN ER 30-600 MG PO TB12: 1.0000 | ORAL_TABLET | Freq: Two times a day (BID) | ORAL | 0 refills | Status: DC | PRN

## 2021-06-12 NOTE — Discharge Instructions (Addendum)
Follow up if not improving.   Drink plenty of fluids.  Tylenol for fever

## 2021-06-12 NOTE — ED Provider Notes (Signed)
[facility] EMERGENCY DEPARTMENT Provider Note   CSN: [id number] Arrival date & time: 06/12/21  1911     History Chief Complaint  Patient presents with   Sore Throat    Sandy Miller is a 20 y.o. female.  Patient complains of a sore throat.  She had a positive COVID test today  The history is provided by the patient and medical records. No language interpreter was used.  Sore Throat This is a new problem. The current episode started 6 to 12 hours ago. The problem occurs constantly. The problem has not changed since onset.Pertinent negatives include no chest pain, no abdominal pain and no headaches. Nothing aggravates the symptoms. Nothing relieves the symptoms. She has tried nothing for the symptoms.      Past Medical History:  Diagnosis Date   Anxiety    Elevated TSH    [facility] Obtained Result of TSH 11 11/07/18 (results faxed to [facility])   History of depression     Patient Active Problem List   Diagnosis Date Noted   Pain in right ankle and joints of right foot 12/28/2020   Fibromyalgia 08/04/2020   Positive ANA (antinuclear antibody) 07/21/2020   Contraceptive patch status 04/07/2019   Hypothyroidism 02/11/2019   Vitamin D deficiency 02/04/2019   Depression with anxiety 04/04/2016   Stress at home 04/04/2016   Numbness in both hands 02/10/2014   Pallor of extremity 02/10/2014   Melasma 08/21/2013   Keloid of skin 08/21/2013    Past Surgical History:  Procedure Laterality Date   WISDOM TOOTH EXTRACTION  2017   Per patient     OB History     Gravida  0   Para  0   Term  0   Preterm  0   AB  0   Living  0      SAB  0   IAB  0   Ectopic  0   Multiple  0   Live Births  0           Family History  Problem Relation Age of Onset   Other Father        Numbness of Unknown Etiology   Mental illness Father    Arthritis Father    Stomach cancer Maternal Grandfather        Died at 69   Mental illness Mother         Bipolar   Asthma Brother    Arthritis Paternal Grandfather    Multiple sclerosis Paternal Aunt     Social History   Tobacco Use   Smoking status: Passive Smoke Exposure - Never Smoker   Smokeless tobacco: Never  Vaping Use   Vaping Use: Never used  Substance Use Topics   Alcohol use: No   Drug use: No    Home Medications Prior to Admission medications   Medication Sig Start Date End Date Taking? Authorizing Provider  dextromethorphan-guaiFENesin (MUCINEX DM) 30-600 MG 12hr tablet Take 1 tablet by mouth 2 (two) times daily as needed for cough. 06/12/21  Yes Bethann Berkshire, MD  Cholecalciferol (VITAMIN D-3) 125 MCG (5000 UT) TABS Take 1 tablet by mouth daily. 01/12/21   Dani Gobble, NP  ibuprofen (ADVIL) 400 MG tablet Take one tablet every 8 hours as needed for pain and swelling for up to 5 days Patient not taking: No sig reported 06/21/20   Rosiland Oz, MD  levothyroxine (SYNTHROID) 75 MCG tablet As directed 10/12/20   Lurlean Leyden,  Freddi Starr, NP  [employer] 150-35 MCG/24HR transdermal patch APPLY 1 PATCH TO SKIN ONCE A WEEK AS DIRECTED 05/11/20   Rosiland Oz, MD    Allergies    Patient has no known allergies.  Review of Systems   Review of Systems  Constitutional:  Negative for appetite change and fatigue.  HENT:  Negative for congestion, ear discharge and sinus pressure.        Sore throat  Eyes:  Negative for discharge.  Respiratory:  Negative for cough.   Cardiovascular:  Negative for chest pain.  Gastrointestinal:  Negative for abdominal pain and diarrhea.  Genitourinary:  Negative for frequency and hematuria.  Musculoskeletal:  Negative for back pain.  Skin:  Negative for rash.  Neurological:  Negative for seizures and headaches.  Psychiatric/Behavioral:  Negative for hallucinations.    Physical Exam Updated Vital Signs BP 139/78 (BP Location: Right Arm)   Pulse (!) 106   Temp 99.2 F (37.3 C) (Oral)   Resp 16   Ht 5\' 2"  (1.575 m)   Wt 52.2 kg    LMP 06/05/2021   SpO2 99%   BMI 21.03 kg/m   Physical Exam Vitals and nursing note reviewed.  Constitutional:      Appearance: She is well-developed.  HENT:     Head: Normocephalic.     Nose: Nose normal.  Eyes:     General: No scleral icterus.    Conjunctiva/sclera: Conjunctivae normal.  Neck:     Thyroid: No thyromegaly.  Cardiovascular:     Rate and Rhythm: Normal rate and regular rhythm.     Heart sounds: No murmur heard.   No friction rub. No gallop.  Pulmonary:     Breath sounds: No stridor. No wheezing or rales.  Chest:     Chest wall: No tenderness.  Abdominal:     General: There is no distension.     Tenderness: There is no abdominal tenderness. There is no rebound.  Musculoskeletal:        General: Normal range of motion.     Cervical back: Neck supple.  Lymphadenopathy:     Cervical: No cervical adenopathy.  Skin:    Findings: No erythema or rash.  Neurological:     Mental Status: She is alert and oriented to person, place, and time.     Motor: No abnormal muscle tone.     Coordination: Coordination normal.  Psychiatric:        Behavior: Behavior normal.    ED Results / Procedures / Treatments   Labs (all labs ordered are listed, but only abnormal results are displayed) Labs Reviewed - No data to display  EKG None  Radiology No results found.  Procedures Procedures   Medications Ordered in ED Medications - No data to display  ED Course  I have reviewed the triage vital signs and the nursing notes.  Pertinent labs & imaging results that were available during my care of the patient were reviewed by me and considered in my medical decision making (see chart for details).    MDM Rules/Calculators/A&P                           Patient with COVID.  Patient is nontoxic.  He will be discharged home with some Mucinex and follow-up as needed.  She is to take Tylenol and fluids Final Clinical Impression(s) / ED Diagnoses Final diagnoses:  COVID     Rx / DC Orders ED  Discharge Orders          Ordered    dextromethorphan-guaiFENesin [facility] DM) 30-600 MG 12hr tablet  2 times daily PRN        06/12/21 1946             Bethann Berkshire, MD 06/12/21 1952

## 2021-06-12 NOTE — ED Triage Notes (Signed)
Pt states took a covid test at home today and it was positive, says she came here tonight "I just want to make sure my symptoms don't get worse". Fever, cough, sore throat and congestion. NAD

## 2021-06-13 ENCOUNTER — Other Ambulatory Visit: Payer: Self-pay | Admitting: Pediatrics

## 2021-06-14 ENCOUNTER — Ambulatory Visit: Payer: Medicaid Other | Admitting: Nurse Practitioner

## 2021-06-29 NOTE — Patient Instructions (Signed)

## 2021-06-30 ENCOUNTER — Encounter: Payer: Self-pay | Admitting: Nurse Practitioner

## 2021-06-30 ENCOUNTER — Ambulatory Visit (INDEPENDENT_AMBULATORY_CARE_PROVIDER_SITE_OTHER): Payer: Medicaid Other | Admitting: Nurse Practitioner

## 2021-06-30 ENCOUNTER — Other Ambulatory Visit: Payer: Self-pay

## 2021-06-30 VITALS — BP 129/81 | HR 93 | Ht 62.0 in | Wt 115.0 lb

## 2021-06-30 DIAGNOSIS — E559 Vitamin D deficiency, unspecified: Secondary | ICD-10-CM

## 2021-06-30 DIAGNOSIS — E039 Hypothyroidism, unspecified: Secondary | ICD-10-CM

## 2021-06-30 MED ORDER — LEVOTHYROXINE SODIUM 75 MCG PO TABS: ORAL_TABLET | ORAL | 3 refills | Status: DC

## 2021-06-30 NOTE — Progress Notes (Signed)
06/30/2021, 10:59 AM       Endocrinology Follow Up Visit     Subjective:    Patient ID: Sandy Miller, female    DOB: 09/02/2001, PCP Abran Richard, MD   Past Medical History:  Diagnosis Date   Anxiety    Elevated TSH    [facility] Obtained Result of TSH 11 11/07/18 (results faxed to [facility])   History of depression    Past Surgical History:  Procedure Laterality Date   WISDOM TOOTH EXTRACTION  2017   Per patient   Social History   Socioeconomic History   Marital status: Single    Spouse name: Not on file   Number of children: Not on file   Years of education: Not on file   Highest education level: Not on file  Occupational History   Not on file  Tobacco Use   Smoking status: Passive Smoke Exposure - Never Smoker   Smokeless tobacco: Never  Vaping Use   Vaping Use: Never used  Substance and Sexual Activity   Alcohol use: No   Drug use: No   Sexual activity: Never  Other Topics Concern   Not on file  Social History Narrative   Lives with parents and younger brothers      No smokers      Graduated from high school    Social Determinants of Health   Financial Resource Strain: Not on file  Food Insecurity: Not on file  Transportation Needs: Not on file  Physical Activity: Not on file  Stress: Not on file  Social Connections: Not on file   Family History  Problem Relation Age of Onset   Other Father        Numbness of Unknown Etiology   Mental illness Father    Arthritis Father    Stomach cancer Maternal Grandfather        Died at 7   Mental illness Mother        Bipolar   Asthma Brother    Arthritis Paternal Grandfather    Multiple sclerosis Paternal Aunt    Outpatient Encounter Medications as of 06/30/2021  Medication Sig   Cholecalciferol (VITAMIN D-3) 125 MCG (5000 UT) TABS Take 1 tablet by mouth daily.   levothyroxine (SYNTHROID) 75 MCG tablet As directed   XULANE  150-35 MCG/24HR transdermal patch APPLY 1 PATCH TO SKIN ONCE A WEEK AS DIRECTED   [DISCONTINUED] dextromethorphan-guaiFENesin (MUCINEX DM) 30-600 MG 12hr tablet Take 1 tablet by mouth 2 (two) times daily as needed for cough.   [DISCONTINUED] ibuprofen (ADVIL) 400 MG tablet Take one tablet every 8 hours as needed for pain and swelling for up to 5 days (Patient not taking: No sig reported)   [DISCONTINUED] levothyroxine (SYNTHROID) 75 MCG tablet As directed   No facility-administered encounter medications on file as of 06/30/2021.   ALLERGIES: No Known Allergies  VACCINATION STATUS: Immunization History  Administered Date(s) Administered   DTaP 04/26/2000, 07/03/2000, 10/02/2000, 05/21/2001, 01/19/2005   HPV Quadrivalent 05/07/2013, 04/01/2014, 04/30/2015   Hepatitis A 03/05/2006, 02/11/2008   Hepatitis B Apr 24, 2000, 10/02/2000, 02/25/2001   HiB (PRP-OMP) 04/26/2000, 07/03/2000, 10/02/2000, 05/21/2001   IPV 04/26/2000, 07/03/2000, 10/02/2000, 01/19/2005   MMR 02/25/2001, 01/19/2005  Meningococcal B Recombinant 05/02/2016, 09/13/2017   Meningococcal Conjugate 04/01/2014, 05/02/2016   Moderna Sars-Covid-2 Vaccination 02/04/2020, 02/18/2020   Pneumococcal Conjugate-13 04/26/2000, 07/03/2000, 10/02/2000   Tdap 04/24/2012   Varicella 05/21/2001, 03/05/2006    Thyroid Problem Presents for follow-up visit. Symptoms include fatigue. Patient reports no anxiety, cold intolerance, constipation, depressed mood, diarrhea, heat intolerance, menstrual problem, palpitations, tremors, weight gain or weight loss. The symptoms have been stable.   Sandy Miller is 20 y.o. female who is being seen in follow-up for the management of hypothyroidism, vitamin D deficiency.    She is currently on Levothyroxine 75 mcg p.o. daily.  She reports better compliance with the medication, however she still sometimes forgets to take it.   - She has distant cousins with various forms of thyroid dysfunction,  however no family history of thyroid malignancy.     -She denies palpitations, tremors, heat intolerance.  She is eating better, stable weight. -She denies dysphagia, shortness of breath, nor voice change.   Review of systems  Constitutional: + Minimally fluctuating body weight,  current Body mass index is 21.03 kg/m. , + fatigue, no subjective hyperthermia, no subjective hypothermia Eyes: no blurry vision, no xerophthalmia ENT: no sore throat, no nodules palpated in throat, no dysphagia/odynophagia, no hoarseness Cardiovascular: no chest pain, no shortness of breath, no palpitations, no leg swelling Respiratory: no cough, no shortness of breath Gastrointestinal: no nausea/vomiting/diarrhea Musculoskeletal: no muscle/joint aches Skin: no rashes, no hyperemia Neurological: no tremors, no numbness, no tingling, no dizziness Psychiatric: no depression, no anxiety   Objective:    BP 129/81   Pulse 93   Ht 5' 2"  (1.575 m)   Wt 115 lb (52.2 kg)   LMP 06/05/2021   BMI 21.03 kg/m    Wt Readings from Last 3 Encounters:  06/30/21 115 lb (52.2 kg)  06/12/21 115 lb (52.2 kg)  02/09/21 116 lb (52.6 kg)    BP Readings from Last 3 Encounters:  06/30/21 129/81  06/12/21 113/88  02/09/21 116/74     Physical Exam- Limited  Constitutional:  Body mass index is 21.03 kg/m. , not in acute distress, normal state of mind Eyes:  EOMI, no exophthalmos Neck: Supple Cardiovascular: RRR, no murmurs, rubs, or gallops, no edema Respiratory: Adequate breathing efforts, no crackles, rales, rhonchi, or wheezing Musculoskeletal: no gross deformities, strength intact in all four extremities, no gross restriction of joint movements Skin:  no rashes, no hyperemia Neurological: no tremor with outstretched hands    CMP     Component Value Date/Time   NA 135 10/10/2013 1239   K 4.1 10/10/2013 1239   CL 99 10/10/2013 1239   CO2 25 10/10/2013 1239   GLUCOSE 78 10/10/2013 1239   BUN 9  10/10/2013 1239   CREATININE 0.87 10/10/2013 1239   CALCIUM 9.8 10/10/2013 1239     Diabetic Labs (most recent): Lab Results  Component Value Date   HGBA1C 5.3 11/07/2018   HGBA1C 5.7 (H) 10/10/2013     Lab Results  Component Value Date   TSH 4.950 (H) 05/26/2021   TSH 9.460 (H) 01/27/2021   TSH 2.260 10/06/2020   TSH 1.29 07/22/2020   TSH 8.74 (H) 05/28/2020   TSH 7.85 (H) 12/30/2019   TSH 3.69 09/02/2019   TSH 4.58 (H) 05/08/2019   TSH 4.72 (H) 02/04/2019   TSH 11.04 (A) 11/07/2018   FREET4 1.30 05/26/2021   FREET4 1.31 01/27/2021   FREET4 1.57 10/06/2020   FREET4 1.3 05/28/2020   FREET4 1.2 12/30/2019  FREET4 1.2 09/02/2019   FREET4 1.3 05/08/2019   FREET4 1.1 02/04/2019      Assessment & Plan:   1.  Hypothyroidism:  -Her previsit TFTs are consistent with appropriate hormone replacement.  She is advised to continue Levothyroxine 75 mcg po daily before breakfast and to take it consistently. Refill sent in today.   - We discussed about the correct intake of her thyroid hormone, on empty stomach at fasting, with water, separated by at least 30 minutes from breakfast and other medications,  and separated by more than 4 hours from calcium, iron, multivitamins, acid reflux medications (PPIs). -Patient is made aware of the fact that thyroid hormone replacement is needed for life, dose to be adjusted by periodic monitoring of thyroid function tests.  She is informed to notify clinic if she confirms or plans pregnancy, that she will need a higher dose during the first trimester.    2.  Vitamin D deficiency: Her recent vitamin D level from 01/27/21 was 48.  She is still finishing her replenishment with Ergocalciferol and can discontinue supplementation afterwards.   - I advised her to maintain close follow up with Abran Richard, MD for primary care needs.      I spent 20 minutes in the care of the patient today including review of labs from Thyroid Function, CMP,  and other relevant labs ; imaging/biopsy records (current and previous including abstractions from other facilities); face-to-face time discussing  her lab results and symptoms, medications doses, her options of short and long term treatment based on the latest standards of care / guidelines;   and documenting the encounter.  Sandy Miller  participated in the discussions, expressed understanding, and voiced agreement with the above plans.  All questions were answered to her satisfaction. she is encouraged to contact clinic should she have any questions or concerns prior to her return visit.   Follow up plan: Return in about 4 months (around 10/31/2021) for Thyroid follow up, Previsit labs.   Rayetta Pigg, [facility] [address] [place], [place] [address] Phone: [phone] Fax: [phone]   06/30/2021, 10:59 AM

## 2021-07-18 ENCOUNTER — Encounter: Payer: Self-pay | Admitting: Nurse Practitioner

## 2021-08-18 ENCOUNTER — Other Ambulatory Visit: Payer: Self-pay | Admitting: Nurse Practitioner

## 2021-08-18 DIAGNOSIS — E039 Hypothyroidism, unspecified: Secondary | ICD-10-CM | POA: Diagnosis not present

## 2021-08-18 LAB — T4, FREE: Free T4: 1.1 ng/dL (ref 0.8–1.8)

## 2021-08-18 LAB — TSH: TSH: 3.71 mIU/L

## 2021-09-27 ENCOUNTER — Encounter: Payer: Self-pay | Admitting: Nurse Practitioner

## 2021-09-27 DIAGNOSIS — E039 Hypothyroidism, unspecified: Secondary | ICD-10-CM

## 2021-10-21 ENCOUNTER — Other Ambulatory Visit: Payer: Self-pay | Admitting: Nurse Practitioner

## 2021-10-21 DIAGNOSIS — M791 Myalgia, unspecified site: Secondary | ICD-10-CM | POA: Diagnosis not present

## 2021-10-21 DIAGNOSIS — M549 Dorsalgia, unspecified: Secondary | ICD-10-CM | POA: Diagnosis not present

## 2021-10-21 DIAGNOSIS — E039 Hypothyroidism, unspecified: Secondary | ICD-10-CM | POA: Diagnosis not present

## 2021-10-21 LAB — TSH+FREE T4
Free T4: 1.4 ng/dL (ref 0.8–1.8)
TSH: 2.03 mIU/L

## 2021-11-02 ENCOUNTER — Other Ambulatory Visit: Payer: Self-pay

## 2021-11-02 ENCOUNTER — Ambulatory Visit: Payer: Medicaid Other | Admitting: Nurse Practitioner

## 2021-11-02 ENCOUNTER — Encounter: Payer: Self-pay | Admitting: Nurse Practitioner

## 2021-11-02 VITALS — BP 122/76 | HR 92 | Ht 62.0 in | Wt 115.2 lb

## 2021-11-02 DIAGNOSIS — E039 Hypothyroidism, unspecified: Secondary | ICD-10-CM | POA: Diagnosis not present

## 2021-11-02 DIAGNOSIS — E559 Vitamin D deficiency, unspecified: Secondary | ICD-10-CM | POA: Diagnosis not present

## 2021-11-02 NOTE — Progress Notes (Signed)
11/02/2021, 9:39 AM       Endocrinology Follow Up Visit     Subjective:    Patient ID: Sandy Miller, female    DOB: 2001/09/02, PCP Abran Richard, MD   Past Medical History:  Diagnosis Date   Anxiety    Elevated TSH    [facility] Obtained Result of TSH 11 11/07/18 (results faxed to [facility])   History of depression    Past Surgical History:  Procedure Laterality Date   WISDOM TOOTH EXTRACTION  2017   Per patient   Social History   Socioeconomic History   Marital status: Single    Spouse name: Not on file   Number of children: Not on file   Years of education: Not on file   Highest education level: Not on file  Occupational History   Not on file  Tobacco Use   Smoking status: Never    Passive exposure: Yes   Smokeless tobacco: Never  Vaping Use   Vaping Use: Never used  Substance and Sexual Activity   Alcohol use: No   Drug use: No   Sexual activity: Never  Other Topics Concern   Not on file  Social History Narrative   Lives with parents and younger brothers      No smokers      Graduated from high school    Social Determinants of Health   Financial Resource Strain: Not on file  Food Insecurity: Not on file  Transportation Needs: Not on file  Physical Activity: Not on file  Stress: Not on file  Social Connections: Not on file   Family History  Problem Relation Age of Onset   Other Father        Numbness of Unknown Etiology   Mental illness Father    Arthritis Father    Stomach cancer Maternal Grandfather        Died at 53   Mental illness Mother        Bipolar   Asthma Brother    Arthritis Paternal Grandfather    Multiple sclerosis Paternal Aunt    Outpatient Encounter Medications as of 11/02/2021  Medication Sig   levothyroxine (SYNTHROID) 75 MCG tablet As directed   XULANE 150-35 MCG/24HR transdermal patch APPLY 1 PATCH TO SKIN ONCE A WEEK AS DIRECTED    Cholecalciferol (VITAMIN D-3) 125 MCG (5000 UT) TABS Take 1 tablet by mouth daily. (Patient not taking: Reported on 11/02/2021)   cyclobenzaprine (FLEXERIL) 10 MG tablet Take 10 mg by mouth at bedtime as needed. (Patient not taking: Reported on 11/02/2021)   methylPREDNISolone (MEDROL DOSEPAK) 4 MG TBPK tablet Take by mouth as directed. (Patient not taking: Reported on 11/02/2021)   naproxen (NAPROSYN) 375 MG tablet SMARTSIG:1 Tablet(s) By Mouth Every 12 Hours PRN (Patient not taking: Reported on 11/02/2021)   No facility-administered encounter medications on file as of 11/02/2021.   ALLERGIES: No Known Allergies  VACCINATION STATUS: Immunization History  Administered Date(s) Administered   DTaP 04/26/2000, 07/03/2000, 10/02/2000, 05/21/2001, 01/19/2005   HPV Quadrivalent 05/07/2013, 04/01/2014, 04/30/2015   Hepatitis A 03/05/2006, 02/11/2008   Hepatitis B 03-Feb-2000, 10/02/2000, 02/25/2001   HiB (PRP-OMP) 04/26/2000, 07/03/2000, 10/02/2000, 05/21/2001   IPV 04/26/2000, 07/03/2000, 10/02/2000, 01/19/2005  MMR 02/25/2001, 01/19/2005   Meningococcal B Recombinant 05/02/2016, 09/13/2017   Meningococcal Conjugate 04/01/2014, 05/02/2016   Moderna Sars-Covid-2 Vaccination 02/04/2020, 02/18/2020   Pneumococcal Conjugate-13 04/26/2000, 07/03/2000, 10/02/2000   Tdap 04/24/2012   Varicella 05/21/2001, 03/05/2006    Thyroid Problem Presents for follow-up visit. Symptoms include fatigue. Patient reports no anxiety, cold intolerance, constipation, depressed mood, diarrhea, heat intolerance, menstrual problem, palpitations, tremors, weight gain or weight loss. The symptoms have been stable.   Sandy Miller is 21 y.o. female who is being seen in follow-up for the management of hypothyroidism, vitamin D deficiency.    She is currently on Levothyroxine 75 mcg p.o. daily.  She reports better compliance with the medication, however she still sometimes forgets to take it.   - She has distant  cousins with various forms of thyroid dysfunction, however no family history of thyroid malignancy.     -She denies palpitations, tremors, heat intolerance.  She is eating better, stable weight. -She denies dysphagia, shortness of breath, nor voice change.   Review of systems  Constitutional: + Minimally fluctuating body weight,  current Body mass index is 21.07 kg/m. , no fatigue, no subjective hyperthermia, no subjective hypothermia Eyes: no blurry vision, no xerophthalmia ENT: no sore throat, no nodules palpated in throat, no dysphagia/odynophagia, no hoarseness Cardiovascular: no chest pain, no shortness of breath, no palpitations, no leg swelling Respiratory: no cough, no shortness of breath Gastrointestinal: no nausea/vomiting/diarrhea Musculoskeletal: + diffuse joint aches- has seen rheumatology last year, sees PCP tomorrow Skin: no rashes, no hyperemia Neurological: no tremors, no numbness, no tingling, no dizziness Psychiatric: no depression, no anxiety   Objective:    BP 122/76    Pulse 92    Ht _0  (1.575 m)    Wt 115 lb 3.2 oz (52.3 kg)    SpO2 98%    BMI 21.07 kg/m    Wt Readings from Last 3 Encounters:  11/02/21 115 lb 3.2 oz (52.3 kg)  06/30/21 115 lb (52.2 kg)  06/12/21 115 lb (52.2 kg)    BP Readings from Last 3 Encounters:  11/02/21 122/76  06/30/21 129/81  06/12/21 113/88      Physical Exam- Limited  Constitutional:  Body mass index is 21.07 kg/m. , not in acute distress, flat affect Eyes:  EOMI, no exophthalmos Neck: Supple Cardiovascular: RRR, no murmurs, rubs, or gallops, no edema Respiratory: Adequate breathing efforts, no crackles, rales, rhonchi, or wheezing Musculoskeletal: no gross deformities, strength intact in all four extremities, no gross restriction of joint movements Skin:  no rashes, no hyperemia Neurological: no tremor with outstretched hands    CMP     Component Value Date/Time   NA 135 10/10/2013 1239   K 4.1 10/10/2013  1239   CL 99 10/10/2013 1239   CO2 25 10/10/2013 1239   GLUCOSE 78 10/10/2013 1239   BUN 9 10/10/2013 1239   CREATININE 0.87 10/10/2013 1239   CALCIUM 9.8 10/10/2013 1239     Diabetic Labs (most recent): Lab Results  Component Value Date   HGBA1C 5.3 11/07/2018   HGBA1C 5.7 (H) 10/10/2013     Lab Results  Component Value Date   TSH 2.03 10/21/2021   TSH 3.71 08/18/2021   TSH 4.950 (H) 05/26/2021   TSH 9.460 (H) 01/27/2021   TSH 2.260 10/06/2020   TSH 1.29 07/22/2020   TSH 8.74 (H) 05/28/2020   TSH 7.85 (H) 12/30/2019   TSH 3.69 09/02/2019   TSH 4.58 (H) 05/08/2019   FREET4 1.4 10/21/2021  FREET4 1.1 08/18/2021   FREET4 1.30 05/26/2021   FREET4 1.31 01/27/2021   FREET4 1.57 10/06/2020   FREET4 1.3 05/28/2020   FREET4 1.2 12/30/2019   FREET4 1.2 09/02/2019   FREET4 1.3 05/08/2019   FREET4 1.1 02/04/2019      Latest Reference Range & Units 01/27/21 08:45 05/26/21 08:51 08/18/21 13:28 10/21/21 10:49  TSH mIU/L 9.460 (H) 4.950 (H) 3.71 2.03  T4,Free(Direct) 0.8 - 1.8 ng/dL 1.31 1.30 1.1 1.4  (H): Data is abnormally high Assessment & Plan:   1.  Hypothyroidism:  -Her previsit TFTs are consistent with appropriate hormone replacement.  She is advised to continue Levothyroxine 75 mcg po daily before breakfast.   - We discussed about the correct intake of her thyroid hormone, on empty stomach at fasting, with water, separated by at least 30 minutes from breakfast and other medications,  and separated by more than 4 hours from calcium, iron, multivitamins, acid reflux medications (PPIs). -Patient is made aware of the fact that thyroid hormone replacement is needed for life, dose to be adjusted by periodic monitoring of thyroid function tests.  She is informed to notify clinic if she confirms or plans pregnancy, that she will need a higher dose during the first trimester.    2.  Vitamin D deficiency: Her recent vitamin D level from 01/27/21 was 48.  She is not currently  on any vitamin D supplementation.  Will repeat vitamin D level prior to next visit.   - I advised her to maintain close follow up with Abran Richard, MD for primary care needs.     I spent 20 minutes in the care of the patient today including review of labs from Thyroid Function, CMP, and other relevant labs ; imaging/biopsy records (current and previous including abstractions from other facilities); face-to-face time discussing  her lab results and symptoms, medications doses, her options of short and long term treatment based on the latest standards of care / guidelines;   and documenting the encounter.  Sandy Miller  participated in the discussions, expressed understanding, and voiced agreement with the above plans.  All questions were answered to her satisfaction. she is encouraged to contact clinic should she have any questions or concerns prior to her return visit.   Follow up plan: Return in about 4 months (around 03/02/2022) for Thyroid follow up, Previsit labs.   Rayetta Pigg, [facility] [address] [place], [place] [address] Phone: [phone] Fax: [phone]   11/02/2021, 9:39 AM

## 2021-11-02 NOTE — Patient Instructions (Signed)

## 2021-11-03 DIAGNOSIS — M542 Cervicalgia: Secondary | ICD-10-CM | POA: Diagnosis not present

## 2021-11-03 DIAGNOSIS — Z6821 Body mass index (BMI) 21.0-21.9, adult: Secondary | ICD-10-CM | POA: Diagnosis not present

## 2021-11-03 DIAGNOSIS — F4320 Adjustment disorder, unspecified: Secondary | ICD-10-CM | POA: Diagnosis not present

## 2021-11-03 DIAGNOSIS — J302 Other seasonal allergic rhinitis: Secondary | ICD-10-CM | POA: Diagnosis not present

## 2021-11-03 DIAGNOSIS — E039 Hypothyroidism, unspecified: Secondary | ICD-10-CM | POA: Diagnosis not present

## 2021-11-03 DIAGNOSIS — R768 Other specified abnormal immunological findings in serum: Secondary | ICD-10-CM | POA: Diagnosis not present

## 2021-12-12 DIAGNOSIS — H5213 Myopia, bilateral: Secondary | ICD-10-CM | POA: Diagnosis not present

## 2021-12-12 DIAGNOSIS — H5203 Hypermetropia, bilateral: Secondary | ICD-10-CM | POA: Diagnosis not present

## 2022-01-05 NOTE — Progress Notes (Signed)
? ?Office Visit Note ? ?Patient: Sandy Miller             ?Date of Birth: [birth year]           ?MRN: [id number]             ?PCP: Abran Richard, MD ?Referring: Abran Richard, MD ?Visit Date: 01/06/2022 ? ? ?Subjective:  ?Follow-up (Neck and back pain) ? ? ?History of Present Illness: Marlette T Maday is a 21 y.o. female here for follow up for fatigue, joint pains, and positive ANA.  Her main issue has been back pain that comes and goes at multiple levels mostly in the mid and upper back sometimes extends up the neck contributing to pounding headaches that can last for hours or relieved when sleeping.  This has been off and on but more severe since around January with higher frequency.  Last year she had some adjustment to her thyroid medications but otherwise no significant medical events and no injuries.  She was seen for this problem and prescribed a steroid taper and Flexeril but did not take these medicines since she is not sure if she needed them and does not like to take medicines.  She had x-ray of the cervical spine was unremarkable.  Does not get a significant benefit with over-the-counter medicines but also does not take these much.  She stays fatigued on a daily basis. ? ?Previous HPI ?12/28/20 ?Janell T Falzone is a 21 y.o. female here for follow up for fatigue, joint pains, and positive ANA. At initial visits no inflammatory changes were seen. Since then she has increased problems particularly with the right leg and ankle. Pain is getting worse during the day with walking and weightbearing and localizes to the heel and lateral side of the foot. She does not notice much swelling or discoloration. She does not have generalized change in joint pain outside of this. Symptoms are slightly responsive to OTC analgesics. ? ? ?Review of Systems  ?Constitutional:  Positive for fatigue.  ?HENT:  Negative for mouth dryness.   ?Eyes:  Negative for dryness.  ?Respiratory:  Positive for shortness of  breath.   ?Cardiovascular:  Negative for swelling in legs/feet.  ?Gastrointestinal:  Negative for constipation.  ?Endocrine: Positive for cold intolerance and heat intolerance.  ?Genitourinary:  Negative for difficulty urinating.  ?Musculoskeletal:  Positive for joint pain, joint pain, muscle weakness, morning stiffness and muscle tenderness.  ?Skin:  Negative for rash.  ?Allergic/Immunologic: Positive for susceptible to infections.  ?Neurological:  Positive for numbness and weakness.  ?Hematological:  Negative for bruising/bleeding tendency.  ?Psychiatric/Behavioral:  Positive for sleep disturbance.   ? ?PMFS History:  ?Patient Active Problem List  ? Diagnosis Date Noted  ? Pain in right ankle and joints of right foot 12/28/2020  ? Fibromyalgia 08/04/2020  ? Positive ANA (antinuclear antibody) 07/21/2020  ? Contraceptive patch status 04/07/2019  ? Hypothyroidism 02/11/2019  ? Vitamin D deficiency 02/04/2019  ? Depression with anxiety 04/04/2016  ? Stress at home 04/04/2016  ? Numbness in both hands 02/10/2014  ? Pallor of extremity 02/10/2014  ? Melasma 08/21/2013  ? Keloid of skin 08/21/2013  ?  ?Past Medical History:  ?Diagnosis Date  ? Anxiety   ? Elevated TSH   ? [facility] Obtained Result of TSH 11 11/07/18 (results faxed to [facility])  ? History of depression   ?  ?Family History  ?Problem Relation Age of Onset  ? Other Father   ?  Numbness of Unknown Etiology  ? Mental illness Father   ? Arthritis Father   ? Stomach cancer Maternal Grandfather   ?     Died at 59  ? Mental illness Mother   ?     Bipolar  ? Asthma Brother   ? Arthritis Paternal Grandfather   ? Multiple sclerosis Paternal Aunt   ? ?Past Surgical History:  ?Procedure Laterality Date  ? WISDOM TOOTH EXTRACTION  2017  ? Per patient  ? ?Social History  ? ?Social History Narrative  ? Lives with parents and younger brothers  ?   ? No smokers  ?   ? Graduated from high school   ? ?Immunization History  ?Administered Date(s) Administered   ? DTaP 04/26/2000, 07/03/2000, 10/02/2000, 05/21/2001, 01/19/2005  ? HPV Quadrivalent 05/07/2013, 04/01/2014, 04/30/2015  ? Hepatitis A 03/05/2006, 02/11/2008  ? Hepatitis B 06/09/2000, 10/02/2000, 02/25/2001  ? HiB (PRP-OMP) 04/26/2000, 07/03/2000, 10/02/2000, 05/21/2001  ? IPV 04/26/2000, 07/03/2000, 10/02/2000, 01/19/2005  ? MMR 02/25/2001, 01/19/2005  ? Meningococcal B Recombinant 05/02/2016, 09/13/2017  ? Meningococcal Conjugate 04/01/2014, 05/02/2016  ? Moderna Sars-Covid-2 Vaccination 02/04/2020, 02/18/2020  ? Pneumococcal Conjugate-13 04/26/2000, 07/03/2000, 10/02/2000  ? Tdap 04/24/2012  ? Varicella 05/21/2001, 03/05/2006  ?  ? ?Objective: ?Vital Signs: BP 119/79 (BP Location: Left Arm, Patient Position: Sitting, Cuff Size: Normal)   Pulse 81   Resp 14   Ht 5' 2"  (1.575 m)   Wt 117 lb (53.1 kg)   BMI 21.40 kg/m?   ? ?Physical Exam ?Eyes:  ?   Conjunctiva/sclera: Conjunctivae normal.  ?Cardiovascular:  ?   Rate and Rhythm: Normal rate and regular rhythm.  ?Pulmonary:  ?   Effort: Pulmonary effort is normal.  ?   Breath sounds: Normal breath sounds.  ?Musculoskeletal:  ?   Right lower leg: No edema.  ?   Left lower leg: No edema.  ?Skin: ?   General: Skin is warm and dry.  ?   Comments: Small patch of hyperpigmented skin on right side of face no macules or papules  ?Neurological:  ?   Mental Status: She is alert.  ?Psychiatric:     ?   Mood and Affect: Mood normal.  ?  ? ?Musculoskeletal Exam:  ?Neck full ROM no tenderness ?Shoulders full ROM no tenderness or swelling ?Elbows full ROM no tenderness or swelling ?Wrists full ROM no tenderness or swelling ?Fingers full ROM no tenderness or swelling ?There is mild thoracic paraspinal muscle tenderness to palpation she reports some radiation up and down ?Knees full ROM no tenderness or swelling ?Ankles full ROM no tenderness or swelling ? ? ?Investigation: ?No additional findings. ? ?Imaging: ?No results found. ? ?Recent Labs: ?Lab Results  ?Component Value  Date  ? WBC 7.2 12/30/2019  ? HGB 13.2 12/30/2019  ? PLT 268 12/30/2019  ? NA 135 10/10/2013  ? K 4.1 10/10/2013  ? CL 99 10/10/2013  ? CO2 25 10/10/2013  ? GLUCOSE 78 10/10/2013  ? BUN 9 10/10/2013  ? CREATININE 0.87 10/10/2013  ? CALCIUM 9.8 10/10/2013  ? ? ?Speciality Comments: No specialty comments available. ? ?Procedures:  ?No procedures performed ?Allergies: Patient has no known allergies.  ? ?Assessment / Plan:     ?Visit Diagnoses: Positive ANA (antinuclear antibody) ? ?She has had persistent positive ANA with no specific disease markers so far.  I still question if this could be related to her chronic hypothyroidism.  Due to the questionable test results we will send for a AVISE labs.  She is not interested in an empiric trial of DMARD since she wants to avoid unnecessary medication. ? ?Myofascial pain ? ?Current pain looks consistent with myofascial pain there is some radiation of tender points in the mid and upper back.  Headache with question occipital versus tension type headache related to muscle dysfunction in the lower neck.  I recommend if symptoms do bother her significantly could try taking the prescribed Flexeril at night as needed or over-the-counter NSAIDs. ? ? ?Orders: ?No orders of the defined types were placed in this encounter. ? ?No orders of the defined types were placed in this encounter. ? ? ? ?Follow-Up Instructions: No follow-ups on file. ? ? ?Collier Salina, MD ? ?Note - This record has been created using [employer].  ?Chart creation errors have been sought, but may not always  ?have been located. Such creation errors do not reflect on  ?the standard of medical care. ? ?

## 2022-01-06 ENCOUNTER — Ambulatory Visit: Payer: Medicaid Other | Admitting: Internal Medicine

## 2022-01-06 ENCOUNTER — Encounter: Payer: Self-pay | Admitting: Internal Medicine

## 2022-01-06 VITALS — BP 119/79 | HR 81 | Resp 14 | Ht 62.0 in | Wt 117.0 lb

## 2022-01-06 DIAGNOSIS — M7918 Myalgia, other site: Secondary | ICD-10-CM

## 2022-01-06 DIAGNOSIS — R768 Other specified abnormal immunological findings in serum: Secondary | ICD-10-CM | POA: Diagnosis not present

## 2022-01-06 DIAGNOSIS — M797 Fibromyalgia: Secondary | ICD-10-CM

## 2022-01-06 DIAGNOSIS — E039 Hypothyroidism, unspecified: Secondary | ICD-10-CM

## 2022-01-19 ENCOUNTER — Encounter: Payer: Self-pay | Admitting: *Deleted

## 2022-01-25 DIAGNOSIS — Z713 Dietary counseling and surveillance: Secondary | ICD-10-CM | POA: Diagnosis not present

## 2022-01-25 DIAGNOSIS — R1013 Epigastric pain: Secondary | ICD-10-CM | POA: Diagnosis not present

## 2022-01-26 ENCOUNTER — Other Ambulatory Visit: Payer: Self-pay | Admitting: Family Medicine

## 2022-01-26 ENCOUNTER — Other Ambulatory Visit (HOSPITAL_COMMUNITY): Payer: Self-pay | Admitting: Family Medicine

## 2022-01-26 DIAGNOSIS — R1013 Epigastric pain: Secondary | ICD-10-CM

## 2022-02-02 ENCOUNTER — Ambulatory Visit (HOSPITAL_COMMUNITY)
Admission: RE | Admit: 2022-02-02 | Discharge: 2022-02-02 | Disposition: A | Discharge status: Home / Self Care | Payer: Medicaid Other | Source: Ambulatory Visit | Attending: Family Medicine | Admitting: Family Medicine

## 2022-02-02 DIAGNOSIS — R1013 Epigastric pain: Secondary | ICD-10-CM | POA: Diagnosis not present

## 2022-02-02 DIAGNOSIS — R945 Abnormal results of liver function studies: Secondary | ICD-10-CM | POA: Diagnosis not present

## 2022-02-22 ENCOUNTER — Other Ambulatory Visit: Payer: Self-pay | Admitting: Nurse Practitioner

## 2022-02-22 DIAGNOSIS — Z713 Dietary counseling and surveillance: Secondary | ICD-10-CM | POA: Diagnosis not present

## 2022-02-22 DIAGNOSIS — R1013 Epigastric pain: Secondary | ICD-10-CM | POA: Diagnosis not present

## 2022-02-22 DIAGNOSIS — E039 Hypothyroidism, unspecified: Secondary | ICD-10-CM | POA: Diagnosis not present

## 2022-02-22 DIAGNOSIS — E559 Vitamin D deficiency, unspecified: Secondary | ICD-10-CM | POA: Diagnosis not present

## 2022-02-23 LAB — TSH: TSH: 3.48 mIU/L

## 2022-02-23 LAB — T4, FREE: Free T4: 1.1 ng/dL (ref 0.8–1.8)

## 2022-02-23 LAB — VITAMIN D 25 HYDROXY (VIT D DEFICIENCY, FRACTURES): Vit D, 25-Hydroxy: 23 ng/mL — ABNORMAL LOW (ref 30–100)

## 2022-03-02 ENCOUNTER — Ambulatory Visit: Payer: Medicaid Other | Admitting: Nurse Practitioner

## 2022-03-07 NOTE — Patient Instructions (Signed)

## 2022-03-08 ENCOUNTER — Telehealth: Payer: Self-pay | Admitting: Internal Medicine

## 2022-03-08 ENCOUNTER — Encounter: Payer: Self-pay | Admitting: Nurse Practitioner

## 2022-03-08 ENCOUNTER — Ambulatory Visit: Payer: Medicaid Other | Admitting: Nurse Practitioner

## 2022-03-08 VITALS — BP 117/75 | HR 85 | Ht 62.0 in | Wt 115.0 lb

## 2022-03-08 DIAGNOSIS — E039 Hypothyroidism, unspecified: Secondary | ICD-10-CM

## 2022-03-08 DIAGNOSIS — E559 Vitamin D deficiency, unspecified: Secondary | ICD-10-CM

## 2022-03-08 MED ORDER — LEVOTHYROXINE SODIUM 88 MCG PO TABS: 88.0000 ug | ORAL_TABLET | Freq: Every day | ORAL | 1 refills | Status: DC

## 2022-03-08 MED ORDER — VITAMIN D (ERGOCALCIFEROL) 1.25 MG (50000 UNIT) PO CAPS: 50000.0000 [IU] | ORAL_CAPSULE | ORAL | 0 refills | Status: DC

## 2022-03-08 NOTE — Telephone Encounter (Signed)
Patient called the office requesting a call back if we have received her AVISE labs.

## 2022-03-08 NOTE — Progress Notes (Signed)
03/08/2022, 10:31 AM       Endocrinology Follow Up Visit     Subjective:    Patient ID: Sandy Miller, female    DOB: 16-Aug-2001, PCP Abran Richard, MD   Past Medical History:  Diagnosis Date   Anxiety    Elevated TSH    [facility] Obtained Result of TSH 11 11/07/18 (results faxed to [facility])   History of depression    Past Surgical History:  Procedure Laterality Date   WISDOM TOOTH EXTRACTION  2017   Per patient   Social History   Socioeconomic History   Marital status: Single    Spouse name: Not on file   Number of children: Not on file   Years of education: Not on file   Highest education level: Not on file  Occupational History   Not on file  Tobacco Use   Smoking status: Never    Passive exposure: Yes   Smokeless tobacco: Never  Vaping Use   Vaping Use: Never used  Substance and Sexual Activity   Alcohol use: No   Drug use: No   Sexual activity: Never  Other Topics Concern   Not on file  Social History Narrative   Lives with parents and younger brothers      No smokers      Graduated from high school    Social Determinants of Health   Financial Resource Strain: Not on file  Food Insecurity: Not on file  Transportation Needs: Not on file  Physical Activity: Not on file  Stress: Not on file  Social Connections: Not on file   Family History  Problem Relation Age of Onset   Other Father        Numbness of Unknown Etiology   Mental illness Father    Arthritis Father    Stomach cancer Maternal Grandfather        Died at 76   Mental illness Mother        Bipolar   Asthma Brother    Arthritis Paternal Grandfather    Multiple sclerosis Paternal Aunt    Outpatient Encounter Medications as of 03/08/2022  Medication Sig   omeprazole (PRILOSEC) 40 MG capsule 1 capsule 30 minutes before morning meal   Vitamin D, Ergocalciferol, (DRISDOL) 1.25 MG (50000 UNIT) CAPS capsule Take  1 capsule (50,000 Units total) by mouth every 7 (seven) days.   [DISCONTINUED] levothyroxine (SYNTHROID) 75 MCG tablet Take 88 mcg by mouth daily before breakfast.   levothyroxine (SYNTHROID) 88 MCG tablet Take 1 tablet (88 mcg total) by mouth daily before breakfast.   XULANE 150-35 MCG/24HR transdermal patch APPLY 1 PATCH TO SKIN ONCE A WEEK AS DIRECTED   [DISCONTINUED] Cholecalciferol (VITAMIN D-3) 125 MCG (5000 UT) TABS Take 1 tablet by mouth daily. (Patient not taking: Reported on 11/02/2021)   [DISCONTINUED] cyclobenzaprine (FLEXERIL) 10 MG tablet Take 10 mg by mouth at bedtime as needed. (Patient not taking: Reported on 11/02/2021)   [DISCONTINUED] levothyroxine (SYNTHROID) 75 MCG tablet As directed   [DISCONTINUED] methylPREDNISolone (MEDROL DOSEPAK) 4 MG TBPK tablet Take by mouth as directed. (Patient not taking: Reported on 11/02/2021)   [DISCONTINUED] naproxen (NAPROSYN) 375 MG tablet SMARTSIG:1 Tablet(s) By Mouth Every 12  Hours PRN (Patient not taking: Reported on 11/02/2021)   No facility-administered encounter medications on file as of 03/08/2022.   ALLERGIES: No Known Allergies  VACCINATION STATUS: Immunization History  Administered Date(s) Administered   DTaP 04/26/2000, 07/03/2000, 10/02/2000, 05/21/2001, 01/19/2005   HPV Quadrivalent 05/07/2013, 04/01/2014, 04/30/2015   Hepatitis A 03/05/2006, 02/11/2008   Hepatitis B 30-Oct-1999, 10/02/2000, 02/25/2001   HiB (PRP-OMP) 04/26/2000, 07/03/2000, 10/02/2000, 05/21/2001   IPV 04/26/2000, 07/03/2000, 10/02/2000, 01/19/2005   MMR 02/25/2001, 01/19/2005   Meningococcal B Recombinant 05/02/2016, 09/13/2017   Meningococcal Conjugate 04/01/2014, 05/02/2016   Moderna Sars-Covid-2 Vaccination 02/04/2020, 02/18/2020   Pneumococcal Conjugate-13 04/26/2000, 07/03/2000, 10/02/2000   Tdap 04/24/2012   Varicella 05/21/2001, 03/05/2006    Thyroid Problem Presents for follow-up visit. Symptoms include fatigue. Patient reports no anxiety, cold  intolerance, constipation, depressed mood, diarrhea, heat intolerance, menstrual problem, palpitations, tremors, weight gain or weight loss. The symptoms have been stable.    Sandy Miller is 21 y.o. female who is being seen in follow-up for the management of hypothyroidism, vitamin D deficiency.    She is currently on Levothyroxine 75 mcg p.o. daily.  She reports better compliance with the medication, however she still sometimes forgets to take it.   - She has distant cousins with various forms of thyroid dysfunction, however no family history of thyroid malignancy.     -She denies palpitations, tremors, heat intolerance.  She is eating better, stable weight. -She denies dysphagia, shortness of breath, nor voice change.   Review of systems  Constitutional: + Minimally fluctuating body weight,  current Body mass index is 21.03 kg/m. , + fatigue, no subjective hyperthermia, no subjective hypothermia Eyes: no blurry vision, no xerophthalmia ENT: no sore throat, no nodules palpated in throat, no dysphagia/odynophagia, no hoarseness Cardiovascular: no chest pain, no shortness of breath, no palpitations, no leg swelling Respiratory: no cough, no shortness of breath Gastrointestinal: no nausea/vomiting/diarrhea Musculoskeletal: + diffuse joint aches- has seen rheumatology  Skin: no rashes, no hyperemia Neurological: no tremors, no numbness, no tingling, no dizziness Psychiatric: no depression, no anxiety   Objective:    BP 117/75   Pulse 85   Ht _0  (1.575 m)   Wt 115 lb (52.2 kg)   BMI 21.03 kg/m    Wt Readings from Last 3 Encounters:  03/08/22 115 lb (52.2 kg)  01/06/22 117 lb (53.1 kg)  11/02/21 115 lb 3.2 oz (52.3 kg)    BP Readings from Last 3 Encounters:  03/08/22 117/75  01/06/22 119/79  11/02/21 122/76    Physical Exam- Limited  Constitutional:  Body mass index is 21.03 kg/m. , not in acute distress, flat affect Eyes:  EOMI, no exophthalmos Neck:  Supple Cardiovascular: RRR, no murmurs, rubs, or gallops, no edema Respiratory: Adequate breathing efforts, no crackles, rales, rhonchi, or wheezing Musculoskeletal: no gross deformities, strength intact in all four extremities, no gross restriction of joint movements Skin:  no rashes, no hyperemia Neurological: no tremor with outstretched hands    CMP     Component Value Date/Time   NA 135 10/10/2013 1239   K 4.1 10/10/2013 1239   CL 99 10/10/2013 1239   CO2 25 10/10/2013 1239   GLUCOSE 78 10/10/2013 1239   BUN 9 10/10/2013 1239   CREATININE 0.87 10/10/2013 1239   CALCIUM 9.8 10/10/2013 1239     Diabetic Labs (most recent): Lab Results  Component Value Date   HGBA1C 5.3 11/07/2018   HGBA1C 5.7 (H) 10/10/2013     Lab Results  Component Value Date   TSH 3.48 02/22/2022   TSH 2.03 10/21/2021   TSH 3.71 08/18/2021   TSH 4.950 (H) 05/26/2021   TSH 9.460 (H) 01/27/2021   TSH 2.260 10/06/2020   TSH 1.29 07/22/2020   TSH 8.74 (H) 05/28/2020   TSH 7.85 (H) 12/30/2019   TSH 3.69 09/02/2019   FREET4 1.1 02/22/2022   FREET4 1.4 10/21/2021   FREET4 1.1 08/18/2021   FREET4 1.30 05/26/2021   FREET4 1.31 01/27/2021   FREET4 1.57 10/06/2020   FREET4 1.3 05/28/2020   FREET4 1.2 12/30/2019   FREET4 1.2 09/02/2019   FREET4 1.3 05/08/2019      Latest Reference Range & Units 10/21/21 10:49 02/22/22 11:48  TSH mIU/L 2.03 3.48  T4,Free(Direct) 0.8 - 1.8 ng/dL 1.4 1.1   Assessment & Plan:   1.  Hypothyroidism:  -Her previsit thyroid function tests are consistent with slight under-replacement.  She is advised to increase her Levothyroxine to 88 mcg po daily before breakfast.    - We discussed about the correct intake of her thyroid hormone, on empty stomach at fasting, with water, separated by at least 30 minutes from breakfast and other medications,  and separated by more than 4 hours from calcium, iron, multivitamins, acid reflux medications (PPIs). -Patient is made aware  of the fact that thyroid hormone replacement is needed for life, dose to be adjusted by periodic monitoring of thyroid function tests.  She is informed to notify clinic if she confirms or plans pregnancy, that she will need a higher dose during the first trimester.    2.  Vitamin D deficiency: Her recent vitamin D level from 02/22/22 was 23.  She is not currently on any vitamin D supplementation.  I discussed and re-initiated replenishment with Ergocalciferol 50000 units weekly x 12 weeks.    - I advised her to maintain close follow up with Abran Richard, MD for primary care needs.      I spent 21 minutes in the care of the patient today including review of labs from Thyroid Function, CMP, and other relevant labs ; imaging/biopsy records (current and previous including abstractions from other facilities); face-to-face time discussing  her lab results and symptoms, medications doses, her options of short and long term treatment based on the latest standards of care / guidelines;   and documenting the encounter.  Sandy Miller  participated in the discussions, expressed understanding, and voiced agreement with the above plans.  All questions were answered to her satisfaction. she is encouraged to contact clinic should she have any questions or concerns prior to her return visit.   Follow up plan: Return in about 3 months (around 06/08/2022) for Thyroid follow up, Previsit labs.   Rayetta Pigg, [facility] [address] [place], [place] [address] Phone: [phone] Fax: [phone]   03/08/2022, 10:31 AM

## 2022-03-09 NOTE — Telephone Encounter (Signed)
Patent advised Dr. Dimple Casey has reviewed her AV[facility] labs. Patient advised result is negative for lupus and the ANA looks more likely to be a false positive. Her markers for autoimmune thyroid disease are also negative. Patient advised she can get a copy or schedule a follow up if wanting to discuss this further.   Patient advised Dr. Dimple Casey doesn't think she needs to start any specific treatment for autoimmune disease. If her symptoms are getting worse or limiting her we can follow up to discuss options though. Patient expressed understanding.

## 2022-03-09 NOTE — Telephone Encounter (Signed)
I do have these. Result is negative for lupus and the ANA looks more likely to be a false positive. Her markers for autoimmune thyroid disease are also negative. She can get a copy or schedule a follow up if wanting to discuss this further.  I don't think she needs to start any specific treatment for autoimmune disease. If her symptoms are getting worse or limiting her we can follow up to discuss options though.

## 2022-04-15 IMAGING — MR MR ANKLE*R* W/O CM
4 of 5 series · 19 of 40 positions shown · non-contrast
Comparison: Plain films right ankle 01/05/2021.

CLINICAL DATA: Right ankle stiffness and locking for 1 month. No
known injury.

EXAM:
MRI OF THE RIGHT ANKLE WITHOUT CONTRAST
TECHNIQUE: Multiplanar, multisequence MR imaging of the ankle was performed. No
intravenous contrast was administered.

[Series 5: T1 · sagittal · 4.0mm · 0.35mm/px · 3 of 18 slices shown]
[im 1/18]
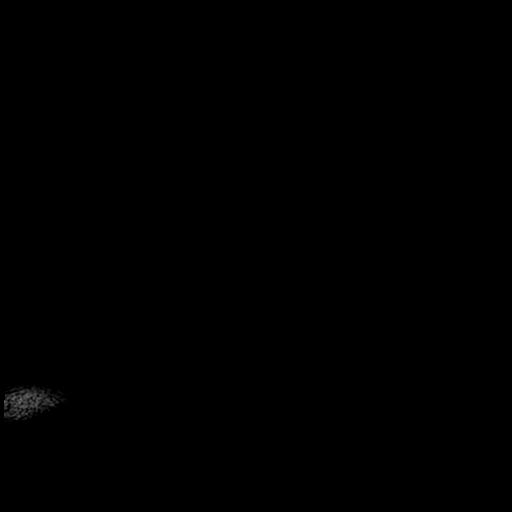
[im 9/18]
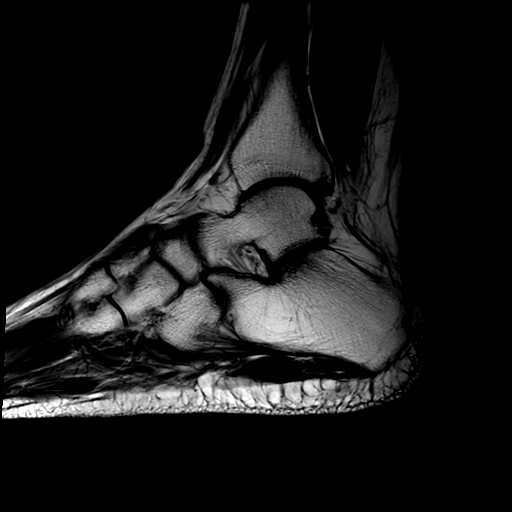
[im 18/18]
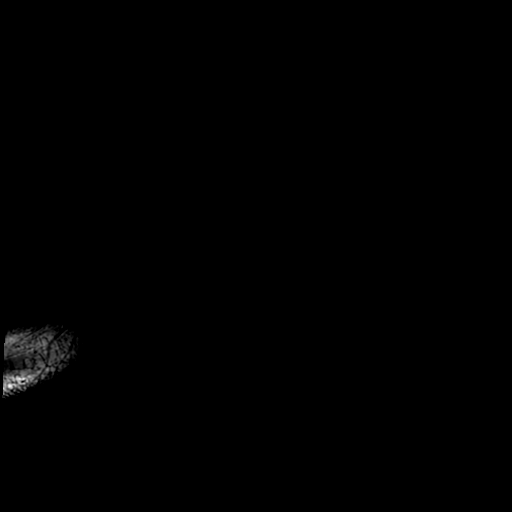

[Series 6: T2 fat-sat · axial · 3.0mm · 0.33mm/px · z∈[-64,+48]mm · 4 of 33 slices shown (1 of 2)]
[im 1/33]
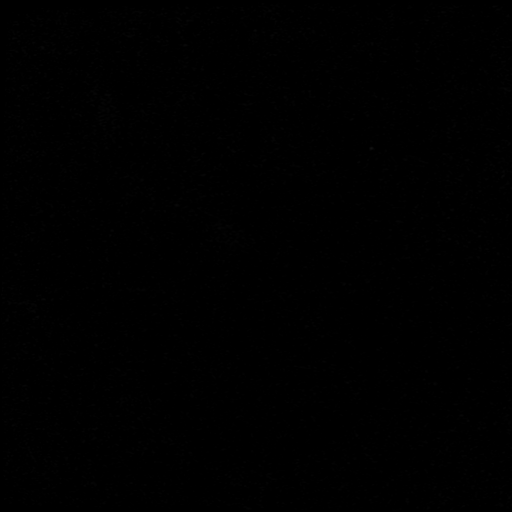
[im 5/33]
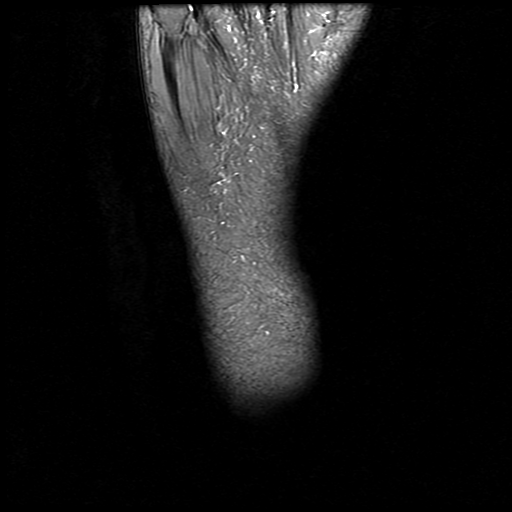
[im 17/33]
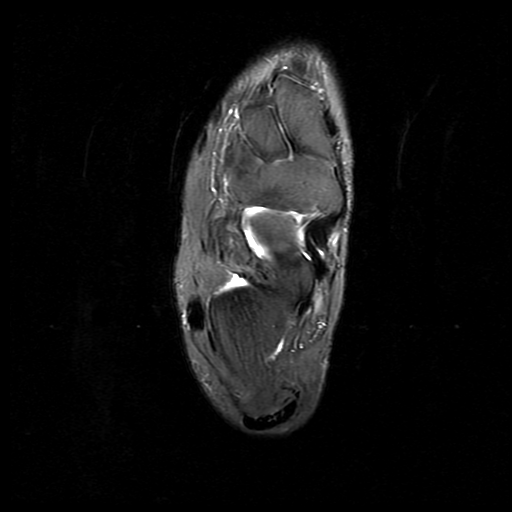
[im 29/33]
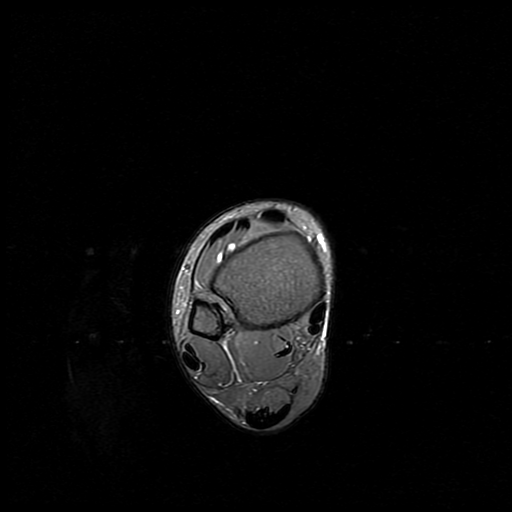

[Series 7: PD fat-sat · axial · 3.0mm · 0.33mm/px · z∈[-64,+64]mm · 9 of 33 slices shown]
[im 1/33]
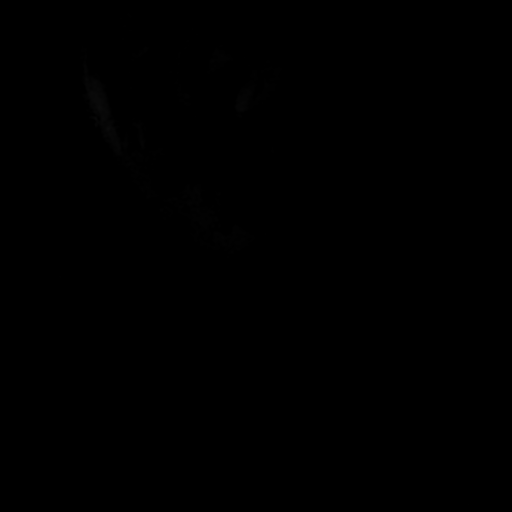
[im 5/33]
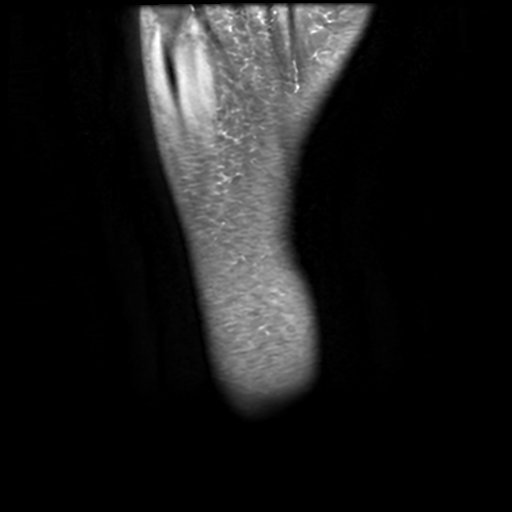
[im 9/33]
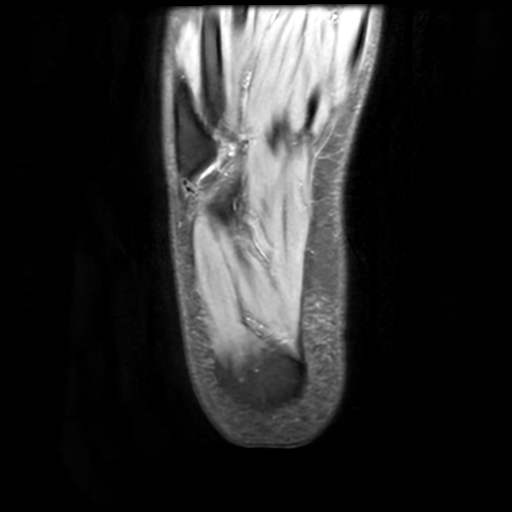
[im 13/33]
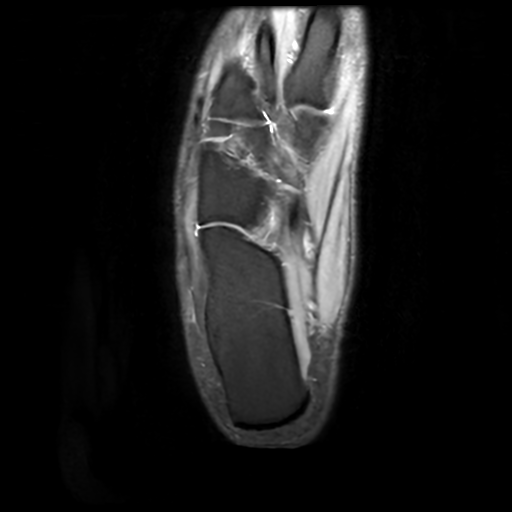
[im 17/33]
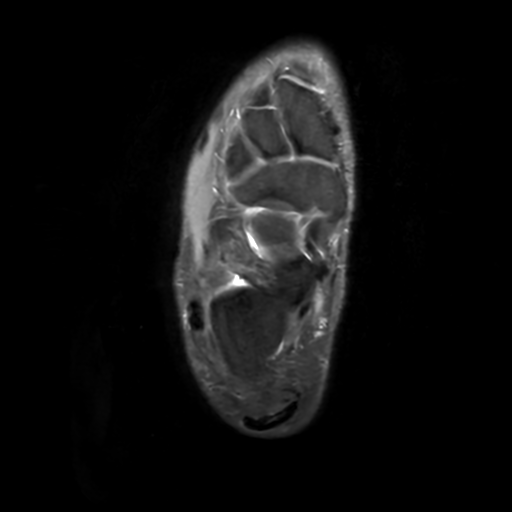
[im 21/33]
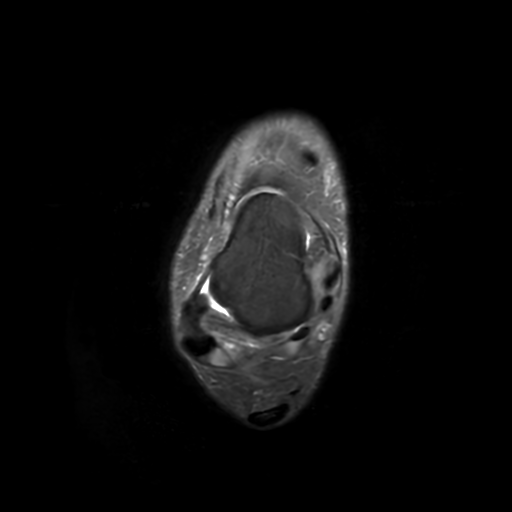
[im 25/33]
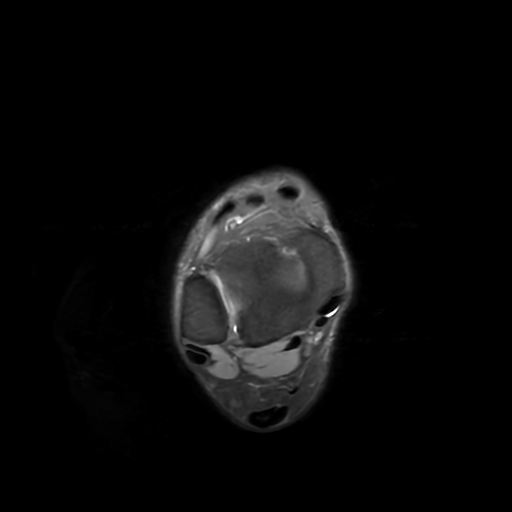
[im 29/33]
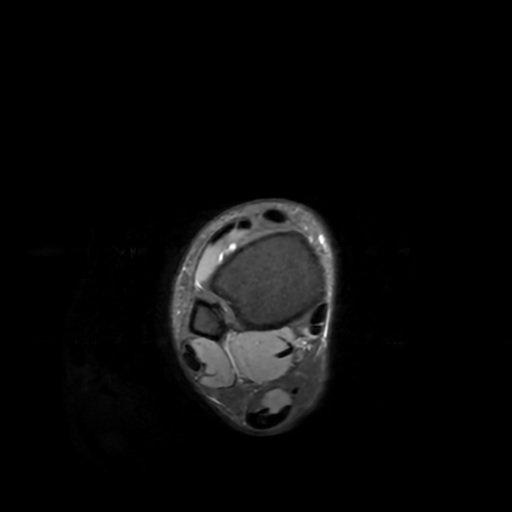
[im 33/33]
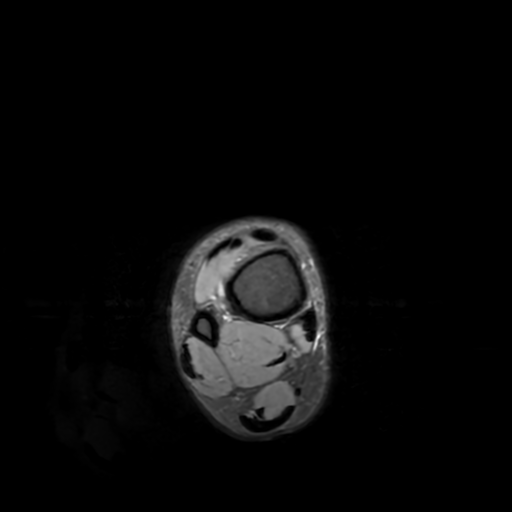

[Series 8: T2 fat-sat · coronal · 3.0mm · 0.31mm/px · 3 of 40 slices shown (2 of 2)]
[im 4/40]
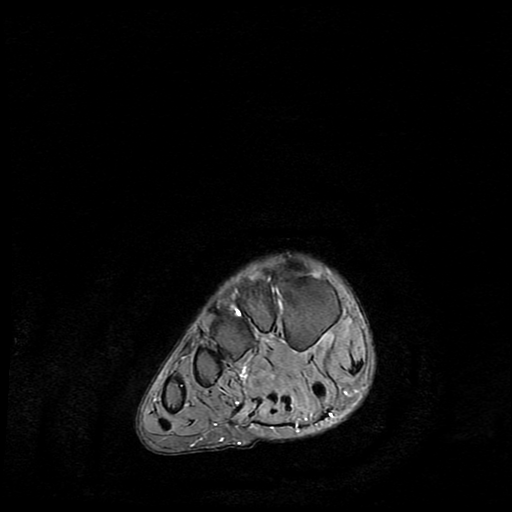
[im 20/40]
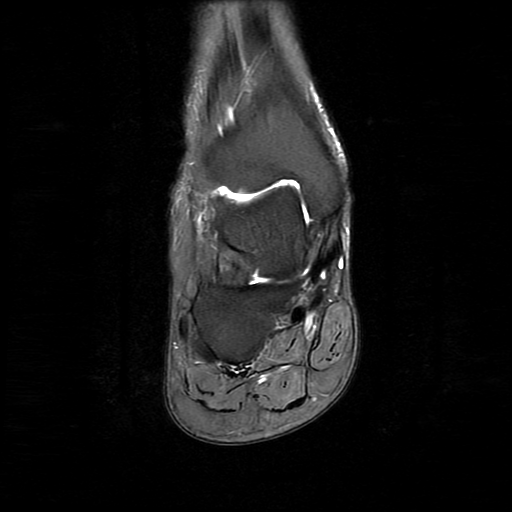
[im 36/40]
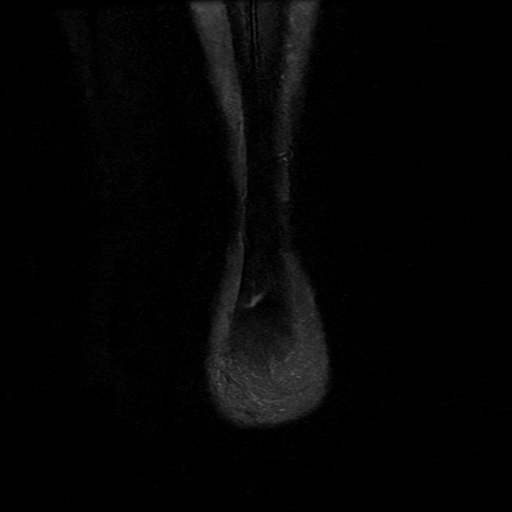

[19 of 40 positions shown; findings below may reference images not displayed]

FINDINGS: TENDONS

Peroneal: Intact.

Posteromedial: Intact.

Anterior: Intact.

Achilles: Intact.

Plantar Fascia: Intact.  Negative for plantar fasciitis.

LIGAMENTS

Lateral: Intact.

Medial: Intact.

CARTILAGE

Ankle Joint: Normal. No joint effusion or osteochondral lesion of
the talar dome.

Subtalar Joints/Sinus Tarsi: Normal.

Bones: Normal. No fracture, stress change or focal lesion. No tarsal
coalition is seen.

Other: None.  No fluid collection or mass.
IMPRESSION: Normal right ankle MRI.

## 2022-05-25 ENCOUNTER — Other Ambulatory Visit: Payer: Self-pay | Admitting: Nurse Practitioner

## 2022-05-26 LAB — TSH: TSH: 2.97 mIU/L

## 2022-05-26 LAB — T4, FREE: Free T4: 1.1 ng/dL (ref 0.8–1.8)

## 2022-06-01 DIAGNOSIS — J069 Acute upper respiratory infection, unspecified: Secondary | ICD-10-CM | POA: Diagnosis not present

## 2022-06-08 ENCOUNTER — Encounter: Payer: Self-pay | Admitting: Nurse Practitioner

## 2022-06-08 ENCOUNTER — Ambulatory Visit (INDEPENDENT_AMBULATORY_CARE_PROVIDER_SITE_OTHER): Payer: Medicaid Other | Admitting: Nurse Practitioner

## 2022-06-08 VITALS — BP 124/78 | HR 79 | Ht 62.0 in | Wt 116.4 lb

## 2022-06-08 DIAGNOSIS — E039 Hypothyroidism, unspecified: Secondary | ICD-10-CM | POA: Diagnosis not present

## 2022-06-08 DIAGNOSIS — E559 Vitamin D deficiency, unspecified: Secondary | ICD-10-CM

## 2022-06-08 MED ORDER — LEVOTHYROXINE SODIUM 88 MCG PO TABS: 88.0000 ug | ORAL_TABLET | Freq: Every day | ORAL | 1 refills | Status: DC

## 2022-06-08 NOTE — Progress Notes (Signed)
06/08/2022, 10:00 AM       Endocrinology Follow Up Visit     Subjective:    Patient ID: Sandy Miller, female    DOB: 2001-05-09, PCP Abran Richard, MD   Past Medical History:  Diagnosis Date   Anxiety    Elevated TSH    [facility] Obtained Result of TSH 11 11/07/18 (results faxed to [facility])   History of depression    Past Surgical History:  Procedure Laterality Date   WISDOM TOOTH EXTRACTION  2017   Per patient   Social History   Socioeconomic History   Marital status: Single    Spouse name: Not on file   Number of children: Not on file   Years of education: Not on file   Highest education level: Not on file  Occupational History   Not on file  Tobacco Use   Smoking status: Never    Passive exposure: Yes   Smokeless tobacco: Never  Vaping Use   Vaping Use: Never used  Substance and Sexual Activity   Alcohol use: No   Drug use: No   Sexual activity: Never  Other Topics Concern   Not on file  Social History Narrative   Lives with parents and younger brothers      No smokers      Graduated from high school    Social Determinants of Health   Financial Resource Strain: Not on file  Food Insecurity: Not on file  Transportation Needs: Not on file  Physical Activity: Not on file  Stress: Not on file  Social Connections: Not on file   Family History  Problem Relation Age of Onset   Other Father        Numbness of Unknown Etiology   Mental illness Father    Arthritis Father    Stomach cancer Maternal Grandfather        Died at 2   Mental illness Mother        Bipolar   Asthma Brother    Arthritis Paternal Grandfather    Multiple sclerosis Paternal Aunt    Outpatient Encounter Medications as of 06/08/2022  Medication Sig   Vitamin D, Ergocalciferol, (DRISDOL) 1.25 MG (50000 UNIT) CAPS capsule Take 1 capsule (50,000 Units total) by mouth every 7 (seven) days.   XULANE 150-35  MCG/24HR transdermal patch APPLY 1 PATCH TO SKIN ONCE A WEEK AS DIRECTED   [DISCONTINUED] levothyroxine (SYNTHROID) 88 MCG tablet Take 1 tablet (88 mcg total) by mouth daily before breakfast.   levothyroxine (SYNTHROID) 88 MCG tablet Take 1 tablet (88 mcg total) by mouth daily before breakfast.   omeprazole (PRILOSEC) 40 MG capsule 1 capsule 30 minutes before morning meal (Patient not taking: Reported on 06/08/2022)   No facility-administered encounter medications on file as of 06/08/2022.   ALLERGIES: No Known Allergies  VACCINATION STATUS: Immunization History  Administered Date(s) Administered   DTaP 04/26/2000, 07/03/2000, 10/02/2000, 05/21/2001, 01/19/2005   HIB (PRP-OMP) 04/26/2000, 07/03/2000, 10/02/2000, 05/21/2001   HPV Quadrivalent 05/07/2013, 04/01/2014, 04/30/2015   Hepatitis A 03/05/2006, 02/11/2008   Hepatitis B 17-Mar-2000, 10/02/2000, 02/25/2001   IPV 04/26/2000, 07/03/2000, 10/02/2000, 01/19/2005   MMR 02/25/2001, 01/19/2005   Meningococcal B Recombinant 05/02/2016, 09/13/2017  Meningococcal Conjugate 04/01/2014, 05/02/2016   Moderna Sars-Covid-2 Vaccination 02/04/2020, 02/18/2020   Pneumococcal Conjugate-13 04/26/2000, 07/03/2000, 10/02/2000   Tdap 04/24/2012   Varicella 05/21/2001, 03/05/2006    Thyroid Problem Presents for follow-up visit. Symptoms include fatigue. Patient reports no anxiety, cold intolerance, constipation, depressed mood, diarrhea, heat intolerance, menstrual problem, palpitations, tremors, weight gain or weight loss. The symptoms have been stable.    Sandy Miller is 21 y.o. female who is being seen in follow-up for the management of hypothyroidism, vitamin D deficiency.    She is currently on Levothyroxine 88 mcg p.o. daily.  She reports better compliance with the medication, however she still sometimes forgets to take it.   - She has distant cousins with various forms of thyroid dysfunction, however no family history of thyroid  malignancy.     -She denies palpitations, tremors, heat intolerance.  She is eating better, stable weight. -She denies dysphagia, shortness of breath, nor voice change.   Review of systems  Constitutional: + Minimally fluctuating body weight,  current Body mass index is 21.29 kg/m. , + fatigue, no subjective hyperthermia, no subjective hypothermia Eyes: no blurry vision, no xerophthalmia ENT: no sore throat, no nodules palpated in throat, no dysphagia/odynophagia, no hoarseness Cardiovascular: no chest pain, no shortness of breath, no palpitations, no leg swelling Respiratory: no cough, no shortness of breath Gastrointestinal: no nausea/vomiting/diarrhea Musculoskeletal: + diffuse joint aches- has seen rheumatology  Skin: no rashes, no hyperemia Neurological: no tremors, no numbness, no tingling, no dizziness Psychiatric: no depression, no anxiety   Objective:    BP 124/78 (BP Location: Right Arm, Patient Position: Sitting, Cuff Size: Normal)   Pulse 79   Ht 5' 2"  (1.575 m)   Wt 116 lb 6.4 oz (52.8 kg)   BMI 21.29 kg/m    Wt Readings from Last 3 Encounters:  06/08/22 116 lb 6.4 oz (52.8 kg)  03/08/22 115 lb (52.2 kg)  01/06/22 117 lb (53.1 kg)    BP Readings from Last 3 Encounters:  06/08/22 124/78  03/08/22 117/75  01/06/22 119/79    Physical Exam- Limited  Constitutional:  Body mass index is 21.29 kg/m. , not in acute distress, flat affect Eyes:  EOMI, no exophthalmos Neck: Supple Cardiovascular: RRR, no murmurs, rubs, or gallops, no edema Respiratory: Adequate breathing efforts, no crackles, rales, rhonchi, or wheezing Musculoskeletal: no gross deformities, strength intact in all four extremities, no gross restriction of joint movements Skin:  no rashes, no hyperemia Neurological: no tremor with outstretched hands    CMP     Component Value Date/Time   NA 135 10/10/2013 1239   K 4.1 10/10/2013 1239   CL 99 10/10/2013 1239   CO2 25 10/10/2013 1239    GLUCOSE 78 10/10/2013 1239   BUN 9 10/10/2013 1239   CREATININE 0.87 10/10/2013 1239   CALCIUM 9.8 10/10/2013 1239     Diabetic Labs (most recent): Lab Results  Component Value Date   HGBA1C 5.3 11/07/2018   HGBA1C 5.7 (H) 10/10/2013     Lab Results  Component Value Date   TSH 2.97 05/25/2022   TSH 3.48 02/22/2022   TSH 2.03 10/21/2021   TSH 3.71 08/18/2021   TSH 4.950 (H) 05/26/2021   TSH 9.460 (H) 01/27/2021   TSH 2.260 10/06/2020   TSH 1.29 07/22/2020   TSH 8.74 (H) 05/28/2020   TSH 7.85 (H) 12/30/2019   FREET4 1.1 05/25/2022   FREET4 1.1 02/22/2022   FREET4 1.4 10/21/2021   FREET4 1.1 08/18/2021  FREET4 1.30 05/26/2021   FREET4 1.31 01/27/2021   FREET4 1.57 10/06/2020   FREET4 1.3 05/28/2020   FREET4 1.2 12/30/2019   FREET4 1.2 09/02/2019      Latest Reference Range & Units 05/26/21 08:51 08/18/21 13:28 10/21/21 10:49 02/22/22 11:48 05/25/22 14:52  TSH mIU/L 4.950 (H) 3.71 2.03 3.48 2.97  T4,Free(Direct) 0.8 - 1.8 ng/dL 1.30 1.1 1.4 1.1 1.1  (H): Data is abnormally high  Assessment & Plan:   1.  Hypothyroidism:  -Her previsit thyroid function tests are consistent with appropriate hormone replacement.  She is advised to continue Levothyroxine 88 mcg po daily before breakfast and to be consistent with taking it.   - We discussed about the correct intake of her thyroid hormone, on empty stomach at fasting, with water, separated by at least 30 minutes from breakfast and other medications,  and separated by more than 4 hours from calcium, iron, multivitamins, acid reflux medications (PPIs). -Patient is made aware of the fact that thyroid hormone replacement is needed for life, dose to be adjusted by periodic monitoring of thyroid function tests.  She is informed to notify clinic if she confirms or plans pregnancy, that she will need a higher dose during the first trimester.    2.  Vitamin D deficiency: Her recent vitamin D level from 02/22/22 was 23.  She is not  currently on any vitamin D supplementation.  I discussed and re-initiated replenishment with Ergocalciferol 50000 units weekly x 12 weeks but she notes she did not finish the prescription as she often forgets to take it.   - I advised her to maintain close follow up with Abran Richard, MD for primary care needs.     I spent 20 minutes in the care of the patient today including review of labs from Thyroid Function, CMP, and other relevant labs ; imaging/biopsy records (current and previous including abstractions from other facilities); face-to-face time discussing  her lab results and symptoms, medications doses, her options of short and long term treatment based on the latest standards of care / guidelines;   and documenting the encounter.  Sandy Miller  participated in the discussions, expressed understanding, and voiced agreement with the above plans.  All questions were answered to her satisfaction. she is encouraged to contact clinic should she have any questions or concerns prior to her return visit.   Follow up plan: Return in about 6 months (around 12/07/2022) for Thyroid follow up, Previsit labs.   Rayetta Pigg, [facility] [address] [place], [place] [address] Phone: [phone] Fax: [phone]   06/08/2022, 10:00 AM

## 2022-06-08 NOTE — Patient Instructions (Signed)

## 2022-07-27 DIAGNOSIS — J302 Other seasonal allergic rhinitis: Secondary | ICD-10-CM | POA: Diagnosis not present

## 2022-07-27 DIAGNOSIS — R69 Illness, unspecified: Secondary | ICD-10-CM | POA: Diagnosis not present

## 2022-07-27 DIAGNOSIS — R1013 Epigastric pain: Secondary | ICD-10-CM | POA: Diagnosis not present

## 2022-07-27 DIAGNOSIS — E039 Hypothyroidism, unspecified: Secondary | ICD-10-CM | POA: Diagnosis not present

## 2022-08-31 ENCOUNTER — Ambulatory Visit: Payer: Medicaid Other | Admitting: Nurse Practitioner

## 2022-10-05 ENCOUNTER — Other Ambulatory Visit: Payer: Self-pay | Admitting: Nurse Practitioner

## 2022-11-01 ENCOUNTER — Telehealth: Payer: Self-pay | Admitting: *Deleted

## 2022-11-01 NOTE — Telephone Encounter (Signed)
Error- Patient called to verify upcoming appointment 11/07/22 at 9:20am with Dr Lacinda Axon

## 2022-11-07 ENCOUNTER — Ambulatory Visit: Payer: Medicaid Other | Admitting: Family Medicine

## 2022-11-07 ENCOUNTER — Encounter: Payer: Self-pay | Admitting: Family Medicine

## 2022-11-07 DIAGNOSIS — Z3045 Encounter for surveillance of transdermal patch hormonal contraceptive device: Secondary | ICD-10-CM | POA: Diagnosis not present

## 2022-11-07 DIAGNOSIS — F419 Anxiety disorder, unspecified: Secondary | ICD-10-CM | POA: Diagnosis not present

## 2022-11-07 MED ORDER — NORELGESTROMIN-ETH ESTRADIOL 150-35 MCG/24HR TD PTWK: MEDICATED_PATCH | TRANSDERMAL | 3 refills | Status: DC

## 2022-11-07 NOTE — Assessment & Plan Note (Signed)
Referring for counseling.

## 2022-11-07 NOTE — Patient Instructions (Signed)
Medication refilled.  Referral placed.  Follow up annually.

## 2022-11-07 NOTE — Assessment & Plan Note (Signed)
Birth control patch refilled today.

## 2022-11-07 NOTE — Progress Notes (Signed)
Subjective:  Patient ID: Sandy Miller, female    DOB: 12-29-2000  Age: 22 y.o. MRN: [id number]  CC: Chief Complaint  Patient presents with   Establish Care    HPI:  22 year old female presents to establish care.  Patient reports that she suffers from anxiety.  She states that it is particular troublesome when she drives.  She states that she is fearful that she is going to get an accident.  Patient is on birth control patch.  Needs refill.  Patient also concerned about her left ear.  Patient has a history of keloid.  She states that she is unable to wear piercings.  She would like me to examine it today.  Patient Active Problem List   Diagnosis Date Noted   Anxiety 11/07/2022   Fibromyalgia 08/04/2020   Positive ANA (antinuclear antibody) 07/21/2020   Contraceptive patch status 04/07/2019   Hypothyroidism 02/11/2019   Vitamin D deficiency 02/04/2019    Social Hx   Social History   Socioeconomic History   Marital status: Single    Spouse name: Not on file   Number of children: Not on file   Years of education: Not on file   Highest education level: Not on file  Occupational History   Not on file  Tobacco Use   Smoking status: Never    Passive exposure: Yes   Smokeless tobacco: Never  Vaping Use   Vaping Use: Never used  Substance and Sexual Activity   Alcohol use: No   Drug use: No   Sexual activity: Never  Other Topics Concern   Not on file  Social History Narrative   Lives with parents and younger brothers      No smokers      Graduated from high school    Social Determinants of Health   Financial Resource Strain: Not on file  Food Insecurity: Not on file  Transportation Needs: Not on file  Physical Activity: Not on file  Stress: Not on file  Social Connections: Not on file    Review of Systems Per HPI  Objective:  BP 132/72   Pulse 96   Temp 98.1 F (36.7 C)   Ht 5' 2"$  (1.575 m)   Wt 123 lb (55.8 kg)   SpO2 100%   BMI 22.50  kg/m      11/07/2022    9:18 AM 06/08/2022    9:42 AM 03/08/2022    9:07 AM  BP/Weight  Systolic BP [id number]  Diastolic BP 72 78 75  Wt. (Lbs) 123 116.4 115  BMI 22.5 kg/m2 21.29 kg/m2 21.03 kg/m2    Physical Exam Vitals and nursing note reviewed.  Constitutional:      General: She is not in acute distress.    Appearance: Normal appearance.  HENT:     Head: Normocephalic and atraumatic.     Ears:     Comments: Left ear -no significant keloid on exam. Cardiovascular:     Rate and Rhythm: Normal rate and regular rhythm.  Pulmonary:     Effort: Pulmonary effort is normal.     Breath sounds: Normal breath sounds. No wheezing, rhonchi or rales.  Neurological:     Mental Status: She is alert.  Psychiatric:     Comments: Flat affect.  Depressed mood.     Lab Results  Component Value Date   WBC 7.2 12/30/2019   HGB 13.2 12/30/2019   HCT 40.8 12/30/2019   PLT 268 12/30/2019   GLUCOSE 78  10/10/2013   NA 135 10/10/2013   K 4.1 10/10/2013   CL 99 10/10/2013   CREATININE 0.87 10/10/2013   BUN 9 10/10/2013   CO2 25 10/10/2013   TSH 2.97 05/25/2022   HGBA1C 5.3 11/07/2018     Assessment & Plan:   Problem List Items Addressed This Visit       Other   Contraceptive patch status    Birth control patch refilled today.      Anxiety    Referring for counseling.      Relevant Orders   Ambulatory referral to Psychology    Meds ordered this encounter  Medications   norelgestromin-ethinyl estradiol Sandy Miller) 150-35 MCG/24HR transdermal patch    Sig: APPLY 1 PATCH TO SKIN ONCE A WEEK AS DIRECTED    Dispense:  12 patch    Refill:  3    Follow-up:  Return in about 1 year (around 11/08/2023).  [place]

## 2022-11-30 DIAGNOSIS — E039 Hypothyroidism, unspecified: Secondary | ICD-10-CM | POA: Diagnosis not present

## 2022-12-01 LAB — TSH: TSH: 8.9 u[IU]/mL — ABNORMAL HIGH (ref 0.450–4.500)

## 2022-12-01 LAB — T4, FREE: Free T4: 1.28 ng/dL (ref 0.82–1.77)

## 2022-12-06 NOTE — Patient Instructions (Signed)

## 2022-12-07 ENCOUNTER — Ambulatory Visit: Payer: Medicaid Other | Admitting: Nurse Practitioner

## 2022-12-07 ENCOUNTER — Encounter: Payer: Self-pay | Admitting: Nurse Practitioner

## 2022-12-07 VITALS — BP 129/79 | HR 69 | Ht 62.0 in | Wt 120.4 lb

## 2022-12-07 DIAGNOSIS — E039 Hypothyroidism, unspecified: Secondary | ICD-10-CM

## 2022-12-07 DIAGNOSIS — E559 Vitamin D deficiency, unspecified: Secondary | ICD-10-CM | POA: Diagnosis not present

## 2022-12-07 MED ORDER — LEVOTHYROXINE SODIUM 88 MCG PO TABS: 88.0000 ug | ORAL_TABLET | Freq: Every day | ORAL | 1 refills | Status: DC

## 2022-12-07 NOTE — Progress Notes (Signed)
12/07/2022, 9:34 AM       Endocrinology Follow Up Visit     Subjective:    Patient ID: Sandy Miller, female    DOB: 31-Jan-2001, PCP Coral Spikes, DO   Past Medical History:  Diagnosis Date   Anxiety    Elevated TSH    [facility] Obtained Result of TSH 11 11/07/18 (results faxed to [facility])   History of depression    Past Surgical History:  Procedure Laterality Date   WISDOM TOOTH EXTRACTION  2017   Per patient   Social History   Socioeconomic History   Marital status: Single    Spouse name: Not on file   Number of children: Not on file   Years of education: Not on file   Highest education level: Not on file  Occupational History   Not on file  Tobacco Use   Smoking status: Never    Passive exposure: Yes   Smokeless tobacco: Never  Vaping Use   Vaping Use: Never used  Substance and Sexual Activity   Alcohol use: No   Drug use: No   Sexual activity: Never  Other Topics Concern   Not on file  Social History Narrative   Lives with parents and younger brothers      No smokers      Graduated from high school    Social Determinants of Health   Financial Resource Strain: Not on file  Food Insecurity: Not on file  Transportation Needs: Not on file  Physical Activity: Not on file  Stress: Not on file  Social Connections: Not on file   Family History  Problem Relation Age of Onset   Other Father        Numbness of Unknown Etiology   Mental illness Father    Arthritis Father    Stomach cancer Maternal Grandfather        Died at 91   Mental illness Mother        Bipolar   Asthma Brother    Arthritis Paternal Grandfather    Multiple sclerosis Paternal Aunt    Outpatient Encounter Medications as of 12/07/2022  Medication Sig   Cholecalciferol 1.25 MG (50000 UT) capsule Take 50,000 Units by mouth once a week.   norelgestromin-ethinyl estradiol (XULANE) 150-35 MCG/24HR transdermal  patch APPLY 1 PATCH TO SKIN ONCE A WEEK AS DIRECTED   [DISCONTINUED] levothyroxine (SYNTHROID) 88 MCG tablet Take 1 tablet (88 mcg total) by mouth daily before breakfast.   levothyroxine (SYNTHROID) 88 MCG tablet Take 1 tablet (88 mcg total) by mouth daily before breakfast.   No facility-administered encounter medications on file as of 12/07/2022.   ALLERGIES: No Known Allergies  VACCINATION STATUS: Immunization History  Administered Date(s) Administered   DTaP 04/26/2000, 07/03/2000, 10/02/2000, 05/21/2001, 01/19/2005   HIB (PRP-OMP) 04/26/2000, 07/03/2000, 10/02/2000, 05/21/2001   HPV Quadrivalent 05/07/2013, 04/01/2014, 04/30/2015   Hepatitis A 03/05/2006, 02/11/2008   Hepatitis B September 08, 2000, 10/02/2000, 02/25/2001   IPV 04/26/2000, 07/03/2000, 10/02/2000, 01/19/2005   MMR 02/25/2001, 01/19/2005   Meningococcal B Recombinant 05/02/2016, 09/13/2017   Meningococcal Conjugate 04/01/2014, 05/02/2016   Moderna Sars-Covid-2 Vaccination 02/04/2020, 02/18/2020   Pneumococcal Conjugate-13 04/26/2000, 07/03/2000, 10/02/2000   Tdap 04/24/2012  Varicella 05/21/2001, 03/05/2006    Thyroid Problem Presents for follow-up visit. Symptoms include fatigue. Patient reports no anxiety, cold intolerance, constipation, depressed mood, diarrhea, heat intolerance, menstrual problem, palpitations, tremors, weight gain or weight loss. The symptoms have been stable.    Sandy Miller is 22 y.o. female who is being seen in follow-up for the management of hypothyroidism, vitamin D deficiency.    She is currently on Levothyroxine 88 mcg p.o. daily.  She reports better compliance with the medication, however she still sometimes forgets to take it.   - She has distant cousins with various forms of thyroid dysfunction, however no family history of thyroid malignancy.     -She denies palpitations, tremors, heat intolerance.  She is eating better, stable weight. -She denies dysphagia, shortness of  breath, nor voice change.   Review of systems  Constitutional: + Minimally fluctuating body weight,  current Body mass index is 22.02 kg/m. , + fatigue, no subjective hyperthermia, no subjective hypothermia Eyes: no blurry vision, no xerophthalmia ENT: no sore throat, no nodules palpated in throat, no dysphagia/odynophagia, + hoarseness worsening and constant Cardiovascular: no chest pain, no shortness of breath, no palpitations, no leg swelling Respiratory: no cough, no shortness of breath Gastrointestinal: no nausea/vomiting/diarrhea Musculoskeletal: + diffuse joint aches- has seen rheumatology  Skin: no rashes, no hyperemia Neurological: no tremors, no numbness, no tingling, no dizziness Psychiatric: no depression, no anxiety   Objective:    BP 129/79 (BP Location: Right Arm, Patient Position: Sitting, Cuff Size: Normal)   Pulse 69   Ht 5\' 2"  (1.575 m)   Wt 120 lb 6.4 oz (54.6 kg)   BMI 22.02 kg/m    Wt Readings from Last 3 Encounters:  12/07/22 120 lb 6.4 oz (54.6 kg)  11/07/22 123 lb (55.8 kg)  06/08/22 116 lb 6.4 oz (52.8 kg)    BP Readings from Last 3 Encounters:  12/07/22 129/79  11/07/22 132/72  06/08/22 124/78    Physical Exam- Limited  Constitutional:  Body mass index is 22.02 kg/m. , not in acute distress, flat affect Eyes:  EOMI, no exophthalmos Musculoskeletal: no gross deformities, strength intact in all four extremities, no gross restriction of joint movements Skin:  no rashes, no hyperemia Neurological: no tremor with outstretched hands    CMP     Component Value Date/Time   NA 135 10/10/2013 1239   K 4.1 10/10/2013 1239   CL 99 10/10/2013 1239   CO2 25 10/10/2013 1239   GLUCOSE 78 10/10/2013 1239   BUN 9 10/10/2013 1239   CREATININE 0.87 10/10/2013 1239   CALCIUM 9.8 10/10/2013 1239     Diabetic Labs (most recent): Lab Results  Component Value Date   HGBA1C 5.3 11/07/2018   HGBA1C 5.7 (H) 10/10/2013     Lab Results  Component  Value Date   TSH 8.900 (H) 11/30/2022   TSH 2.97 05/25/2022   TSH 3.48 02/22/2022   TSH 2.03 10/21/2021   TSH 3.71 08/18/2021   TSH 4.950 (H) 05/26/2021   TSH 9.460 (H) 01/27/2021   TSH 2.260 10/06/2020   TSH 1.29 07/22/2020   TSH 8.74 (H) 05/28/2020   FREET4 1.28 11/30/2022   FREET4 1.1 05/25/2022   FREET4 1.1 02/22/2022   FREET4 1.4 10/21/2021   FREET4 1.1 08/18/2021   FREET4 1.30 05/26/2021   FREET4 1.31 01/27/2021   FREET4 1.57 10/06/2020   FREET4 1.3 05/28/2020   FREET4 1.2 12/30/2019      Latest Reference Range & Units 08/18/21 13:28  10/21/21 10:49 02/22/22 11:48 05/25/22 14:52 11/30/22 09:16  TSH 0.450 - 4.500 uIU/mL 3.71 2.03 3.48 2.97 8.900 (H)  T4,Free(Direct) 0.82 - 1.77 ng/dL 1.1 1.4 1.1 1.1 1.28  (H): Data is abnormally high  Assessment & Plan:   1.  Hypothyroidism:  -Her previsit thyroid function tests are consistent with appropriate hormone replacement.  TSH above target but consistent with missing doses here and there.  She is advised to continue Levothyroxine 88 mcg po daily before breakfast and to be consistent with taking it.  I did order thyroid ultrasound to assess thyroid involvement in recent symptoms of constant and worsening hoarseness (will call her with results and plan moving forward).   - We discussed about the correct intake of her thyroid hormone, on empty stomach at fasting, with water, separated by at least 30 minutes from breakfast and other medications,  and separated by more than 4 hours from calcium, iron, multivitamins, acid reflux medications (PPIs). -Patient is made aware of the fact that thyroid hormone replacement is needed for life, dose to be adjusted by periodic monitoring of thyroid function tests.  She is informed to notify clinic if she confirms or plans pregnancy, that she will need a higher dose during the first trimester.    2.  Vitamin D deficiency: Her recent vitamin D level from 02/22/22 was 23.  She is not currently on any  vitamin D supplementation- was previously prescribed Ergocalciferol but forgot to take it consistently. Will recheck vitamin D prior to next visit.   - I advised her to maintain close follow up with Coral Spikes, DO for primary care needs.    I spent  22  minutes in the care of the patient today including review of labs from Thyroid Function, CMP, and other relevant labs ; imaging/biopsy records (current and previous including abstractions from other facilities); face-to-face time discussing  her lab results and symptoms, medications doses, her options of short and long term treatment based on the latest standards of care / guidelines;   and documenting the encounter.  Sandy Miller  participated in the discussions, expressed understanding, and voiced agreement with the above plans.  All questions were answered to her satisfaction. she is encouraged to contact clinic should she have any questions or concerns prior to her return visit.   Follow up plan: Return in about 6 months (around 06/09/2023) for Thyroid follow up, Previsit labs, thyroid ultrasound.   Rayetta Pigg, [facility] [address] [place], [place] [address] Phone: [phone] Fax: [phone]   12/07/2022, 9:34 AM

## 2022-12-21 ENCOUNTER — Ambulatory Visit (HOSPITAL_COMMUNITY)
Admission: RE | Admit: 2022-12-21 | Discharge: 2022-12-21 | Disposition: A | Discharge status: Home / Self Care | Payer: Medicaid Other | Source: Ambulatory Visit | Attending: Nurse Practitioner | Admitting: Nurse Practitioner

## 2022-12-21 DIAGNOSIS — E039 Hypothyroidism, unspecified: Secondary | ICD-10-CM | POA: Diagnosis not present

## 2022-12-22 ENCOUNTER — Encounter: Payer: Self-pay | Admitting: Nurse Practitioner

## 2022-12-22 NOTE — Progress Notes (Signed)
Noted  

## 2023-02-15 ENCOUNTER — Ambulatory Visit (INDEPENDENT_AMBULATORY_CARE_PROVIDER_SITE_OTHER): Payer: Medicaid Other | Admitting: Adult Health

## 2023-02-15 ENCOUNTER — Other Ambulatory Visit (HOSPITAL_COMMUNITY)
Admission: RE | Admit: 2023-02-15 | Discharge: 2023-02-15 | Disposition: A | Discharge status: Home / Self Care | Payer: Medicaid Other | Source: Ambulatory Visit | Attending: Adult Health | Admitting: Adult Health

## 2023-02-15 ENCOUNTER — Encounter: Payer: Self-pay | Admitting: Adult Health

## 2023-02-15 VITALS — BP 128/80 | HR 81 | Ht 62.0 in | Wt 118.0 lb

## 2023-02-15 DIAGNOSIS — F32A Depression, unspecified: Secondary | ICD-10-CM | POA: Diagnosis not present

## 2023-02-15 DIAGNOSIS — Z01419 Encounter for gynecological examination (general) (routine) without abnormal findings: Secondary | ICD-10-CM | POA: Insufficient documentation

## 2023-02-15 DIAGNOSIS — F419 Anxiety disorder, unspecified: Secondary | ICD-10-CM

## 2023-02-15 DIAGNOSIS — Z1339 Encounter for screening examination for other mental health and behavioral disorders: Secondary | ICD-10-CM | POA: Diagnosis not present

## 2023-02-15 NOTE — Addendum Note (Signed)
Addended by: Moss Mc on: 02/15/2023 11:52 AM   Modules accepted: Orders

## 2023-02-15 NOTE — Progress Notes (Signed)
Patient ID: Sandy Miller, female   DOB: 02-18-2001, 22 y.o.   MRN: [id number] History of Present Illness: Sandy Miller is a 22 year old black female,single, G0P0, in for well woman gyn exam and first pap.    Current Medications, Allergies, Past Medical History, Past Surgical History, Family History and Social History were reviewed in [employer] record.     Review of Systems: Patient denies any headaches, hearing loss, fatigue, blurred vision, shortness of breath, chest pain, abdominal pain, problems with bowel movements, urination, or intercourse(has never had sex). No joint pain or mood swings.  She is on the patch for period management from PCP   Physical Exam:BP 128/80 (BP Location: Left Arm, Patient Position: Sitting, Cuff Size: Normal)   Pulse 81   Ht 5\' 2"  (1.575 m)   Wt 118 lb (53.5 kg)   LMP 02/02/2023   BMI 21.58 kg/m   General:  Well developed, well nourished, no acute distress Skin:  Warm and dry Neck:  Midline trachea, normal thyroid, good ROM, no lymphadenopathy Lungs; Clear to auscultation bilaterally Breast:  No dominant palpable mass, retraction, or nipple discharge Cardiovascular: Regular rate and rhythm Abdomen:  Soft, non tender, no hepatosplenomegaly Pelvic:  External genitalia is normal in appearance, no lesions.  The vagina is normal in appearance, small slender Pederson used. Urethra has no lesions or masses. The cervix is nulliparous. Pap with GC/CHL performed.  Uterus is felt to be normal size, shape, and contour.  No adnexal masses or tenderness noted.Bladder is non tender, no masses felt. Extremities/musculoskeletal:  No swelling or varicosities noted, no clubbing or cyanosis Psych:  No mood changes, alert and cooperative,seems happy AA is 2 Fall risk is low    02/15/2023   11:18 AM 11/07/2022    9:30 AM 12/21/2015   11:46 AM  Depression screen PHQ 2/9  Decreased Interest 0 1 0  Down, Depressed, Hopeless 2 1 0  PHQ - 2 Score 2 2  0  Altered sleeping 3 2 2   Tired, decreased energy 2 1 1   Change in appetite 2 0 0  Feeling bad or failure about yourself  1 1 0  Trouble concentrating 2 1 2   Moving slowly or fidgety/restless 2 0 0  Suicidal thoughts 0 0 0  PHQ-9 Score 14 7 5   Difficult doing work/chores  Not difficult at all Not difficult at all       02/15/2023   11:19 AM 11/07/2022    9:30 AM  GAD 7 : Generalized Anxiety Score  Nervous, Anxious, on Edge 3 1  Control/stop worrying 2 1  Worry too much - different things 2 0  Trouble relaxing 2 0  Restless 2 1  Easily annoyed or irritable 3 0  Afraid - awful might happen 2 2  Total GAD 7 Score 16 5  Anxiety Difficulty  Somewhat difficult      Upstream - 02/15/23 1123       Pregnancy Intention Screening   Does the patient want to become pregnant in the next year? No    Does the patient's partner want to become pregnant in the next year? No    Would the patient like to discuss contraceptive options today? No      Contraception Wrap Up   Current Method Abstinence;Contraceptive Patch    End Method Abstinence;Contraceptive Patch    Contraception Counseling Provided No            Examination chaperoned by Juliane Lack CMA  Impression and Plan: 1. Encounter for gynecological examination with Papanicolaou smear of cervix Pap sent Pap in 3 years Physical next year with PCP Labs with PCP  2. Anxiety and depression She is not on meds  Says she is going to see [facility]

## 2023-02-16 ENCOUNTER — Other Ambulatory Visit: Payer: Self-pay | Admitting: Adult Health

## 2023-02-20 LAB — CYTOLOGY - PAP
Chlamydia: NEGATIVE
Comment: NEGATIVE
Comment: NORMAL
Diagnosis: NEGATIVE
Neisseria Gonorrhea: NEGATIVE

## 2023-05-31 ENCOUNTER — Encounter: Payer: Self-pay | Admitting: *Deleted

## 2023-06-07 LAB — T4, FREE: Free T4: 1.32 ng/dL (ref 0.82–1.77)

## 2023-06-07 LAB — VITAMIN D 25 HYDROXY (VIT D DEFICIENCY, FRACTURES): Vit D, 25-Hydroxy: 25.7 ng/mL — ABNORMAL LOW (ref 30.0–100.0)

## 2023-06-07 LAB — TSH: TSH: 5 u[IU]/mL — ABNORMAL HIGH (ref 0.450–4.500)

## 2023-06-11 ENCOUNTER — Encounter: Payer: Self-pay | Admitting: Nurse Practitioner

## 2023-06-11 ENCOUNTER — Ambulatory Visit: Payer: Medicaid Other | Admitting: Nurse Practitioner

## 2023-06-11 VITALS — BP 108/69 | HR 76 | Ht 62.0 in | Wt 117.2 lb

## 2023-06-11 DIAGNOSIS — E039 Hypothyroidism, unspecified: Secondary | ICD-10-CM

## 2023-06-11 DIAGNOSIS — E559 Vitamin D deficiency, unspecified: Secondary | ICD-10-CM

## 2023-06-11 MED ORDER — CHOLECALCIFEROL 1.25 MG (50000 UT) PO CAPS: 50000.0000 [IU] | ORAL_CAPSULE | ORAL | 3 refills | Status: DC

## 2023-06-11 NOTE — Progress Notes (Signed)
06/11/2023, 9:36 AM       Endocrinology Follow Up Visit     Subjective:    Patient ID: Sandy Miller, female    DOB: [birth year], PCP Tommie Sams, DO   Past Medical History:  Diagnosis Date   Anxiety    Elevated TSH    [facility] Obtained Result of TSH 11 11/07/18 (results faxed to [facility])   History of depression    Past Surgical History:  Procedure Laterality Date   WISDOM TOOTH EXTRACTION  2017   Per patient   Social History   Socioeconomic History   Marital status: Single    Spouse name: Not on file   Number of children: Not on file   Years of education: Not on file   Highest education level: Not on file  Occupational History   Not on file  Tobacco Use   Smoking status: Never    Passive exposure: Yes   Smokeless tobacco: Never  Vaping Use   Vaping status: Never Used  Substance and Sexual Activity   Alcohol use: No   Drug use: No   Sexual activity: Never    Birth control/protection: Patch, Abstinence  Other Topics Concern   Not on file  Social History Narrative   Lives with parents and younger brothers      No smokers      Graduated from high school    Social Determinants of Health   Financial Resource Strain: Low Risk  (02/15/2023)   Overall Financial Resource Strain (CARDIA)    Difficulty of Paying Living Expenses: Not hard at all  Food Insecurity: No Food Insecurity (02/15/2023)   Hunger Vital Sign    Worried About Running Out of Food in the Last Year: Never true    Ran Out of Food in the Last Year: Never true  Transportation Needs: No Transportation Needs (02/15/2023)   PRAPARE - Administrator, Civil Service (Medical): No    Lack of Transportation (Non-Medical): No  Physical Activity: Inactive (02/15/2023)   Exercise Vital Sign    Days of Exercise per Week: 0 days    Minutes of Exercise per Session: 0 min  Stress: Stress Concern Present (02/15/2023)   [employer] of Occup[employer] Health - Occupational Stress Questionnaire    Feeling of Stress : Rather much  Social Connections: Socially Isolated (02/15/2023)   Social Connection and Isolation Panel [NHANES]    Frequency of Communication with Friends and Family: Twice a week    Frequency of Social Gatherings with Friends and Family: Never    Attends Religious Services: Never    Database administrator or Organizations: No    Attends Engineer, structural: Never    Marital Status: Never married   Family History  Problem Relation Age of Onset   Arthritis Paternal Grandfather    Stomach cancer Maternal Grandfather        Died at 73   Other Father        Numbness of Unknown Etiology   Mental illness Father    Arthritis Father    Mental illness Mother        Bipolar   Asthma Brother    Multiple  sclerosis Paternal Aunt    Outpatient Encounter Medications as of 06/11/2023  Medication Sig   levothyroxine (SYNTHROID) 88 MCG tablet Take 1 tablet (88 mcg total) by mouth daily before breakfast.   norelgestromin-ethinyl estradiol (ZAFEMY) 150-35 MCG/24HR transdermal patch PLACE 1 PATCH ONTO THE SKIN ONCE WEEKLY.   [DISCONTINUED] Cholecalciferol 1.25 MG (50000 UT) capsule Take 50,000 Units by mouth once a week.   Cholecalciferol 1.25 MG (50000 UT) capsule Take 1 capsule (50,000 Units total) by mouth once a week.   No facility-administered encounter medications on file as of 06/11/2023.   ALLERGIES: No Known Allergies  VACCINATION STATUS: Immunization History  Administered Date(s) Administered   DTaP 04/26/2000, 07/03/2000, 10/02/2000, 05/21/2001, 01/19/2005   HIB (PRP-OMP) 04/26/2000, 07/03/2000, 10/02/2000, 05/21/2001   HPV Quadrivalent 05/07/2013, 04/01/2014, 04/30/2015   Hepatitis A 03/05/2006, 02/11/2008   Hepatitis B 12/27/1999, 10/02/2000, 02/25/2001   IPV 04/26/2000, 07/03/2000, 10/02/2000, 01/19/2005   MMR 02/25/2001, 01/19/2005   Meningococcal B Recombinant 05/02/2016,  09/13/2017   Meningococcal Conjugate 04/01/2014, 05/02/2016   Moderna Sars-Covid-2 Vaccination 02/04/2020, 02/18/2020   Pneumococcal Conjugate-13 04/26/2000, 07/03/2000, 10/02/2000   Tdap 04/24/2012   Varicella 05/21/2001, 03/05/2006    Thyroid Problem Presents for follow-up visit. Symptoms include fatigue. Patient reports no anxiety, cold intolerance, constipation, depressed mood, diarrhea, heat intolerance, menstrual problem, palpitations, tremors, weight gain or weight loss. The symptoms have been stable.    Sandy Miller is 22 y.o. female who is being seen in follow-up for the management of hypothyroidism, vitamin D deficiency.    She is currently on Levothyroxine 88 mcg p.o. daily.  She reports better compliance with the medication, however she still sometimes forgets to take it.   - She has distant cousins with various forms of thyroid dysfunction, however no family history of thyroid malignancy.     -She denies palpitations, tremors, heat intolerance.  She is eating better, stable weight. -She denies dysphagia, shortness of breath, nor voice change.   Review of systems  Constitutional: + Minimally fluctuating body weight,  current Body mass index is 21.44 kg/m. , + fatigue, no subjective hyperthermia, no subjective hypothermia Eyes: no blurry vision, no xerophthalmia ENT: no sore throat, no nodules palpated in throat, no dysphagia/odynophagia, + hoarseness worsening and constant Cardiovascular: no chest pain, no shortness of breath, no palpitations, no leg swelling Respiratory: no cough, no shortness of breath Gastrointestinal: no nausea/vomiting/diarrhea Musculoskeletal: + diffuse joint aches- has seen rheumatology  Skin: no rashes, no hyperemia Neurological: no tremors, no numbness, no tingling, no dizziness Psychiatric: no depression, no anxiety   Objective:    BP 108/69 (BP Location: Right Arm, Patient Position: Sitting, Cuff Size: Large)   Pulse 76   Ht 5'  2" (1.575 m)   Wt 117 lb 3.2 oz (53.2 kg)   BMI 21.44 kg/m    Wt Readings from Last 3 Encounters:  06/11/23 117 lb 3.2 oz (53.2 kg)  02/15/23 118 lb (53.5 kg)  12/07/22 120 lb 6.4 oz (54.6 kg)    BP Readings from Last 3 Encounters:  06/11/23 108/69  02/15/23 128/80  12/07/22 129/79    Physical Exam- Limited  Constitutional:  Body mass index is 21.44 kg/m. , not in acute distress, flat affect Eyes:  EOMI, no exophthalmos Musculoskeletal: no gross deformities, strength intact in all four extremities, no gross restriction of joint movements Skin:  no rashes, no hyperemia Neurological: no tremor with outstretched hands    CMP     Component Value Date/Time   NA 135 10/10/2013 1239  K 4.1 10/10/2013 1239   CL 99 10/10/2013 1239   CO2 25 10/10/2013 1239   GLUCOSE 78 10/10/2013 1239   BUN 9 10/10/2013 1239   CREATININE 0.87 10/10/2013 1239   CALCIUM 9.8 10/10/2013 1239     Diabetic Labs (most recent): Lab Results  Component Value Date   HGBA1C 5.3 11/07/2018   HGBA1C 5.7 (H) 10/10/2013     Lab Results  Component Value Date   TSH 5.000 (H) 06/06/2023   TSH 8.900 (H) 11/30/2022   TSH 2.97 05/25/2022   TSH 3.48 02/22/2022   TSH 2.03 10/21/2021   TSH 3.71 08/18/2021   TSH 4.950 (H) 05/26/2021   TSH 9.460 (H) 01/27/2021   TSH 2.260 10/06/2020   TSH 1.29 07/22/2020   FREET4 1.32 06/06/2023   FREET4 1.28 11/30/2022   FREET4 1.1 05/25/2022   FREET4 1.1 02/22/2022   FREET4 1.4 10/21/2021   FREET4 1.1 08/18/2021   FREET4 1.30 05/26/2021   FREET4 1.31 01/27/2021   FREET4 1.57 10/06/2020   FREET4 1.3 05/28/2020      Latest Reference Range & Units 08/18/21 13:28 10/21/21 10:49 02/22/22 11:48 05/25/22 14:52 11/30/22 09:16 06/06/23 09:23  TSH 0.450 - 4.500 uIU/mL 3.71 2.03 3.48 2.97 8.900 (H) 5.000 (H)  T4,Free(Direct) 0.82 - 1.77 ng/dL 1.1 1.4 1.1 1.1 4.40 1.02  (H): Data is abnormally high  Assessment & Plan:   1.  Hypothyroidism:  -Her previsit thyroid  function tests are consistent with slight under replacement likely due to continued fluctuating compliance with medication.  TSH above target but consistent with missing doses here and there.  She is advised to continue Levothyroxine 88 mcg po daily before breakfast and to be consistent with taking it (close to weight based maximum of 85 mcg).  I   - We discussed about the correct intake of her thyroid hormone, on empty stomach at fasting, with water, separated by at least 30 minutes from breakfast and other medications,  and separated by more than 4 hours from calcium, iron, multivitamins, acid reflux medications (PPIs). -Patient is made aware of the fact that thyroid hormone replacement is needed for life, dose to be adjusted by periodic monitoring of thyroid function tests.  She is informed to notify clinic if she confirms or plans pregnancy, that she will need a higher dose during the first trimester.    2.  Vitamin D deficiency: Her recent vitamin D level from 06/06/23 was 25.7.  She is not currently on any vitamin D supplementation- was previously prescribed Ergocalciferol but ran out.  Will send in refill today.  - I advised her to maintain close follow up with Tommie Sams, DO for primary care needs.    I spent  14  minutes in the care of the patient today including review of labs from Thyroid Function, CMP, and other relevant labs ; imaging/biopsy records (current and previous including abstractions from other facilities); face-to-face time discussing  her lab results and symptoms, medications doses, her options of short and long term treatment based on the latest standards of care / guidelines;   and documenting the encounter.  Albirta T Highland  participated in the discussions, expressed understanding, and voiced agreement with the above plans.  All questions were answered to her satisfaction. she is encouraged to contact clinic should she have any questions or concerns prior to her  return visit.   Follow up plan: Return in about 6 months (around 12/09/2023) for Thyroid follow up, Previsit labs.   Devyne Hauger  Lurlean Leyden [facility] [address] [place], [place] [address] Phone: [phone] Fax: [phone]   06/11/2023, 9:36 AM

## 2023-06-11 NOTE — Patient Instructions (Signed)

## 2023-08-02 ENCOUNTER — Other Ambulatory Visit: Payer: Self-pay | Admitting: Nurse Practitioner

## 2023-09-06 ENCOUNTER — Other Ambulatory Visit: Payer: Self-pay

## 2023-09-06 MED ORDER — LEVOTHYROXINE SODIUM 88 MCG PO TABS: 88.0000 ug | ORAL_TABLET | Freq: Every day | ORAL | 1 refills | Status: DC

## 2023-11-29 LAB — T4, FREE: Free T4: 1.31 ng/dL (ref 0.82–1.77)

## 2023-11-29 LAB — TSH: TSH: 3.6 u[IU]/mL (ref 0.450–4.500)

## 2023-12-10 ENCOUNTER — Ambulatory Visit: Payer: Medicaid Other | Admitting: Nurse Practitioner

## 2023-12-10 ENCOUNTER — Encounter: Payer: Self-pay | Admitting: Nurse Practitioner

## 2023-12-10 VITALS — BP 124/74 | HR 76 | Wt 121.6 lb

## 2023-12-10 DIAGNOSIS — E039 Hypothyroidism, unspecified: Secondary | ICD-10-CM

## 2023-12-10 DIAGNOSIS — E559 Vitamin D deficiency, unspecified: Secondary | ICD-10-CM | POA: Diagnosis not present

## 2023-12-10 MED ORDER — LEVOTHYROXINE SODIUM 88 MCG PO TABS: 88.0000 ug | ORAL_TABLET | Freq: Every day | ORAL | 3 refills | Status: DC

## 2023-12-10 NOTE — Progress Notes (Signed)
 12/10/2023, 9:57 AM       Endocrinology Follow Up Visit     Subjective:    Patient ID: Sandy Miller, female    DOB: Jan 25, 2001, PCP Tommie Sams, DO   Past Medical History:  Diagnosis Date   Anxiety    Elevated TSH    [facility] Obtained Result of TSH 11 11/07/18 (results faxed to [facility])   History of depression    Past Surgical History:  Procedure Laterality Date   WISDOM TOOTH EXTRACTION  2017   Per patient   Social History   Socioeconomic History   Marital status: Single    Spouse name: Not on file   Number of children: Not on file   Years of education: Not on file   Highest education level: Not on file  Occupational History   Not on file  Tobacco Use   Smoking status: Never    Passive exposure: Yes   Smokeless tobacco: Never  Vaping Use   Vaping status: Never Used  Substance and Sexual Activity   Alcohol use: No   Drug use: No   Sexual activity: Never    Birth control/protection: Patch, Abstinence  Other Topics Concern   Not on file  Social History Narrative   Lives with parents and younger brothers      No smokers      Graduated from high school    Social Drivers of Health   Financial Resource Strain: Low Risk  (02/15/2023)   Overall Financial Resource Strain (CARDIA)    Difficulty of Paying Living Expenses: Not hard at all  Food Insecurity: No Food Insecurity (02/15/2023)   Hunger Vital Sign    Worried About Running Out of Food in the Last Year: Never true    Ran Out of Food in the Last Year: Never true  Transportation Needs: No Transportation Needs (02/15/2023)   PRAPARE - Administrator, Civil Service (Medical): No    Lack of Transportation (Non-Medical): No  Physical Activity: Inactive (02/15/2023)   Exercise Vital Sign    Days of Exercise per Week: 0 days    Minutes of Exercise per Session: 0 min  Stress: Stress Concern Present (02/15/2023)   [employer] - Occupational Stress Questionnaire    Feeling of Stress : Rather much  Social Connections: Socially Isolated (02/15/2023)   Social Connection and Isolation Panel [NHANES]    Frequency of Communication with Friends and Family: Twice a week    Frequency of Social Gatherings with Friends and Family: Never    Attends Religious Services: Never    Database administrator or Organizations: No    Attends Engineer, structural: Never    Marital Status: Never married   Family History  Problem Relation Age of Onset   Arthritis Paternal Grandfather    Stomach cancer Maternal Grandfather        Died at 7   Other Father        Numbness of Unknown Etiology   Mental illness Father    Arthritis Father    Mental illness Mother        Bipolar   Asthma Brother    Multiple  sclerosis Paternal Aunt    Outpatient Encounter Medications as of 12/10/2023  Medication Sig   Cholecalciferol 1.25 MG (50000 UT) capsule Take 1 capsule (50,000 Units total) by mouth once a week.   levothyroxine (SYNTHROID) 88 MCG tablet Take 1 tablet (88 mcg total) by mouth daily before breakfast.   norelgestromin-ethinyl estradiol (ZAFEMY) 150-35 MCG/24HR transdermal patch PLACE 1 PATCH ONTO THE SKIN ONCE WEEKLY.   [DISCONTINUED] levothyroxine (SYNTHROID) 88 MCG tablet Take 1 tablet (88 mcg total) by mouth daily before breakfast.   No facility-administered encounter medications on file as of 12/10/2023.   ALLERGIES: No Known Allergies  VACCINATION STATUS: Immunization History  Administered Date(s) Administered   DTaP 04/26/2000, 07/03/2000, 10/02/2000, 05/21/2001, 01/19/2005   HIB (PRP-OMP) 04/26/2000, 07/03/2000, 10/02/2000, 05/21/2001   HPV Quadrivalent 05/07/2013, 04/01/2014, 04/30/2015   Hepatitis A 03/05/2006, 02/11/2008   Hepatitis B 2000-09-15, 10/02/2000, 02/25/2001   IPV 04/26/2000, 07/03/2000, 10/02/2000, 01/19/2005   MMR 02/25/2001, 01/19/2005   Meningococcal B  Recombinant 05/02/2016, 09/13/2017   Meningococcal Conjugate 04/01/2014, 05/02/2016   Moderna Sars-Covid-2 Vaccination 02/04/2020, 02/18/2020   Pneumococcal Conjugate-13 04/26/2000, 07/03/2000, 10/02/2000   Tdap 04/24/2012   Varicella 05/21/2001, 03/05/2006    Thyroid Problem Presents for follow-up visit. Symptoms include fatigue. Patient reports no anxiety, cold intolerance, constipation, depressed mood, diarrhea, heat intolerance, menstrual problem, palpitations, tremors, weight gain or weight loss. The symptoms have been stable.    Sandy Miller is 23 y.o. female who is being seen in follow-up for the management of hypothyroidism, vitamin D deficiency.    She is currently on Levothyroxine 88 mcg p.o. daily.  She reports better compliance with the medication, however she still sometimes forgets to take it.   - She has distant cousins with various forms of thyroid dysfunction, however no family history of thyroid malignancy.     -She denies palpitations, tremors, heat intolerance.  She is eating better, stable weight. -She denies dysphagia, shortness of breath, nor voice change.   Review of systems  Constitutional: + Minimally fluctuating body weight,  current Body mass index is 22.24 kg/m. , + fatigue-stable, no subjective hyperthermia, no subjective hypothermia Eyes: no blurry vision, no xerophthalmia ENT: no sore throat, no nodules palpated in throat, no dysphagia/odynophagia, + intermittent hoarseness Cardiovascular: no chest pain, no shortness of breath, no palpitations, no leg swelling Respiratory: no cough, no shortness of breath Gastrointestinal: no nausea/vomiting/diarrhea Musculoskeletal: + diffuse joint aches- has seen rheumatology  Skin: no rashes, no hyperemia, + dry skin Neurological: no tremors, no numbness, no tingling, no dizziness Psychiatric: no depression, no anxiety   Objective:    BP 124/74 (BP Location: Right Arm, Patient Position: Sitting, Cuff  Size: Normal)   Pulse 76   Wt 121 lb 9.6 oz (55.2 kg)   BMI 22.24 kg/m    Wt Readings from Last 3 Encounters:  12/10/23 121 lb 9.6 oz (55.2 kg)  06/11/23 117 lb 3.2 oz (53.2 kg)  02/15/23 118 lb (53.5 kg)    BP Readings from Last 3 Encounters:  12/10/23 124/74  06/11/23 108/69  02/15/23 128/80    Physical Exam- Limited  Constitutional:  Body mass index is 22.24 kg/m. , not in acute distress, flat affect Eyes:  EOMI, no exophthalmos Musculoskeletal: no gross deformities, strength intact in all four extremities, no gross restriction of joint movements Skin:  no rashes, no hyperemia Neurological: no tremor with outstretched hands    CMP     Component Value Date/Time   NA 135 10/10/2013 1239   K 4.1  10/10/2013 1239   CL 99 10/10/2013 1239   CO2 25 10/10/2013 1239   GLUCOSE 78 10/10/2013 1239   BUN 9 10/10/2013 1239   CREATININE 0.87 10/10/2013 1239   CALCIUM 9.8 10/10/2013 1239     Diabetic Labs (most recent): Lab Results  Component Value Date   HGBA1C 5.3 11/07/2018   HGBA1C 5.7 (H) 10/10/2013     Lab Results  Component Value Date   TSH 3.600 11/28/2023   TSH 5.000 (H) 06/06/2023   TSH 8.900 (H) 11/30/2022   TSH 2.97 05/25/2022   TSH 3.48 02/22/2022   TSH 2.03 10/21/2021   TSH 3.71 08/18/2021   TSH 4.950 (H) 05/26/2021   TSH 9.460 (H) 01/27/2021   TSH 2.260 10/06/2020   FREET4 1.31 11/28/2023   FREET4 1.32 06/06/2023   FREET4 1.28 11/30/2022   FREET4 1.1 05/25/2022   FREET4 1.1 02/22/2022   FREET4 1.4 10/21/2021   FREET4 1.1 08/18/2021   FREET4 1.30 05/26/2021   FREET4 1.31 01/27/2021   FREET4 1.57 10/06/2020      Latest Reference Range & Units 05/25/22 14:52 11/30/22 09:16 06/06/23 09:23 11/28/23 09:02  TSH 0.450 - 4.500 uIU/mL 2.97 8.900 (H) 5.000 (H) 3.600  T4,Free(Direct) 0.82 - 1.77 ng/dL 1.1 1.61 0.96 0.45  (H): Data is abnormally high  Assessment & Plan:   1.  Hypothyroidism:  -Her previsit thyroid function tests are consistent  appropriate hormone replacement.  She is advised to continue Levothyroxine 88 mcg po daily before breakfast.  Will recheck TFTs prior to next visit and adjust dose accordingly.   - We discussed about the correct intake of her thyroid hormone, on empty stomach at fasting, with water, separated by at least 30 minutes from breakfast and other medications,  and separated by more than 4 hours from calcium, iron, multivitamins, acid reflux medications (PPIs). -Patient is made aware of the fact that thyroid hormone replacement is needed for life, dose to be adjusted by periodic monitoring of thyroid function tests.  She is informed to notify clinic if she confirms or plans pregnancy, that she will need a higher dose during the first trimester.    2.  Vitamin D deficiency: Her recent vitamin D level from 06/06/23 was 25.7.  She is currently on Ergocalciferol 50000 units weekly.  Will recheck Vitamin D prior to next visit.  - I advised her to maintain close follow up with Tommie Sams, DO for primary care needs.    I spent  27  minutes in the care of the patient today including review of labs from Thyroid Function, CMP, and other relevant labs ; imaging/biopsy records (current and previous including abstractions from other facilities); face-to-face time discussing  her lab results and symptoms, medications doses, her options of short and long term treatment based on the latest standards of care / guidelines;   and documenting the encounter.  Ethel T Hrivnak  participated in the discussions, expressed understanding, and voiced agreement with the above plans.  All questions were answered to her satisfaction. she is encouraged to contact clinic should she have any questions or concerns prior to her return visit.   Follow up plan: Return in about 6 months (around 06/11/2024) for Thyroid follow up, Previsit labs.  Ronny Bacon, [facility] [address] [place], [place] [address] Phone: [phone] Fax: [phone]   12/10/2023, 9:57 AM

## 2023-12-10 NOTE — Patient Instructions (Signed)

## 2024-02-28 ENCOUNTER — Encounter: Payer: Self-pay | Admitting: Adult Health

## 2024-02-28 ENCOUNTER — Ambulatory Visit: Admitting: Adult Health

## 2024-02-28 VITALS — BP 132/87 | HR 81 | Ht 62.0 in | Wt 117.0 lb

## 2024-02-28 DIAGNOSIS — Z01419 Encounter for gynecological examination (general) (routine) without abnormal findings: Secondary | ICD-10-CM

## 2024-02-28 DIAGNOSIS — Z1331 Encounter for screening for depression: Secondary | ICD-10-CM | POA: Diagnosis not present

## 2024-02-28 DIAGNOSIS — Z3045 Encounter for surveillance of transdermal patch hormonal contraceptive device: Secondary | ICD-10-CM

## 2024-02-28 MED ORDER — NORELGESTROMIN-ETH ESTRADIOL 150-35 MCG/24HR TD PTWK: 1.0000 | MEDICATED_PATCH | TRANSDERMAL | 3 refills | Status: DC

## 2024-02-28 NOTE — Progress Notes (Signed)
 Patient ID: Sandy Miller, female   DOB: 06-05-2001, 23 y.o.   MRN: [id number] History of Present Illness: Sandy Miller is a 23 year old black female,single, G0P0, in for a well woman gyn exam.     Component Value Date/Time   DIAGPAP  02/15/2023 1152    - Negative for intraepithelial lesion or malignancy (NILM)   ADEQPAP  02/15/2023 1152    Satisfactory for evaluation; transformation zone component PRESENT.   PCP is Rosie Cookey at [facility]    Current Medications, Allergies, Past Medical History, Past Surgical History, Family History and Social History were reviewed in [employer] record.     Review of Systems: Patient denies any headaches, hearing loss, fatigue, blurred vision, shortness of breath, chest pain, abdominal pain, problems with bowel movements, urination, or intercourse(has never had sex). No joint pain or mood swings.     Physical Exam:BP 132/87 (BP Location: Right Arm, Patient Position: Sitting, Cuff Size: Normal)   Pulse 81   Ht 5' 2 (1.575 m)   Wt 117 lb (53.1 kg)   LMP 02/10/2024 (Exact Date)   BMI 21.40 kg/m   General:  Well developed, well nourished, no acute distress Skin:  Warm and dry Neck:  Midline trachea, normal thyroid , good ROM, no lymphadenopathy Lungs; Clear to auscultation bilaterally Breast:  No dominant palpable mass, retraction, or nipple discharge Cardiovascular: Regular rate and rhythm Abdomen:  Soft, non tender, no hepatosplenomegaly Pelvic:  External genitalia is normal in appearance, no lesions.  The vagina is normal in appearance. Urethra has no lesions or masses. The cervix is smooth and nulliparous.  Uterus is felt to be normal size, shape, and contour.  No adnexal masses or tenderness noted.Bladder is non tender, no masses felt. Extremities/musculoskeletal:  No swelling or varicosities noted, no clubbing or cyanosis Psych:  No mood changes, alert and cooperative,seems happy AA is 1 Fall risk is low    02/28/2024    12:01 PM 02/15/2023   11:18 AM 11/07/2022    9:30 AM  Depression screen PHQ 2/9  Decreased Interest 1 0 1  Down, Depressed, Hopeless 0 2 1  PHQ - 2 Score 1 2 2   Altered sleeping 1 3 2   Tired, decreased energy 0 2 1  Change in appetite 0 2 0  Feeling bad or failure about yourself  0 1 1  Trouble concentrating 0 2 1  Moving slowly or fidgety/restless 0 2 0  Suicidal thoughts 0 0 0  PHQ-9 Score 2 14 7   Difficult doing work/chores   Not difficult at all       02/28/2024   12:01 PM 02/15/2023   11:19 AM 11/07/2022    9:30 AM  GAD 7 : Generalized Anxiety Score  Nervous, Anxious, on Edge 1 3 1   Control/stop worrying 1 2 1   Worry too much - different things 1 2 0  Trouble relaxing 0 2 0  Restless 1 2 1   Easily annoyed or irritable 2 3 0  Afraid - awful might happen 0 2 2  Total GAD 7 Score 6 16 5   Anxiety Difficulty   Somewhat difficult      Upstream - 02/28/24 1159       Pregnancy Intention Screening   Does the patient want to become pregnant in the next year? No    Does the patient's partner want to become pregnant in the next year? No    Would the patient like to discuss contraceptive options today? No  Contraception Wrap Up   Current Method Contraceptive Patch    End Method Contraceptive Patch    Contraception Counseling Provided Yes           Impression and Plan:  1. Encounter for well woman exam with routine gynecological exam (Primary) Physical in 1 year Pap in 2027   2. Contraceptive patch status Happy with the patch, will refill Meds ordered this encounter  Medications   norelgestromin -ethinyl estradiol  (ZAFEMY) 150-35 MCG/24HR transdermal patch    Sig: Place 1 patch onto the skin once a week.    Dispense:  9 patch    Refill:  3    Supervising Provider:   Wendelyn Halter [[id number]]   Has never had sex

## 2024-06-04 LAB — T4, FREE: Free T4: 1.22 ng/dL (ref 0.82–1.77)

## 2024-06-04 LAB — VITAMIN D 25 HYDROXY (VIT D DEFICIENCY, FRACTURES): Vit D, 25-Hydroxy: 42 ng/mL (ref 30.0–100.0)

## 2024-06-04 LAB — TSH: TSH: 8.95 u[IU]/mL — ABNORMAL HIGH (ref 0.450–4.500)

## 2024-06-06 ENCOUNTER — Encounter: Payer: Self-pay | Admitting: *Deleted

## 2024-06-11 ENCOUNTER — Ambulatory Visit: Admitting: Nurse Practitioner

## 2024-06-11 ENCOUNTER — Encounter: Payer: Self-pay | Admitting: Nurse Practitioner

## 2024-06-11 VITALS — BP 108/70 | HR 73 | Ht 62.0 in

## 2024-06-11 DIAGNOSIS — E039 Hypothyroidism, unspecified: Secondary | ICD-10-CM

## 2024-06-11 DIAGNOSIS — E559 Vitamin D deficiency, unspecified: Secondary | ICD-10-CM | POA: Diagnosis not present

## 2024-06-11 MED ORDER — CHOLECALCIFEROL 1.25 MG (50000 UT) PO CAPS: 50000.0000 [IU] | ORAL_CAPSULE | ORAL | 3 refills | Status: AC

## 2024-06-11 MED ORDER — LEVOTHYROXINE SODIUM 88 MCG PO TABS: 88.0000 ug | ORAL_TABLET | Freq: Every day | ORAL | 3 refills | Status: AC

## 2024-06-11 NOTE — Progress Notes (Signed)
 06/11/2024, 10:10 AM       Endocrinology Follow Up Visit     Subjective:    Patient ID: Sandy Miller, female    DOB: 2000/12/24, PCP Margarete Maeola ONEIDA, FNP   Past Medical History:  Diagnosis Date   Anxiety    Elevated TSH    [facility] Obtained Result of TSH 11 11/07/18 (results faxed to [facility])   History of depression    Past Surgical History:  Procedure Laterality Date   WISDOM TOOTH EXTRACTION  2017   Per patient   Social History   Socioeconomic History   Marital status: Single    Spouse name: Not on file   Number of children: Not on file   Years of education: Not on file   Highest education level: Not on file  Occupational History   Not on file  Tobacco Use   Smoking status: Never    Passive exposure: Yes   Smokeless tobacco: Never  Vaping Use   Vaping status: Never Used  Substance and Sexual Activity   Alcohol use: Yes   Drug use: No   Sexual activity: Never    Birth control/protection: Patch, Abstinence  Other Topics Concern   Not on file  Social History Narrative   Lives with parents and younger brothers      No smokers      Graduated from high school    Social Drivers of Health   Financial Resource Strain: Low Risk  (02/28/2024)   Overall Financial Resource Strain (CARDIA)    Difficulty of Paying Living Expenses: Not hard at all  Food Insecurity: No Food Insecurity (02/28/2024)   Hunger Vital Sign    Worried About Running Out of Food in the Last Year: Never true    Ran Out of Food in the Last Year: Never true  Transportation Needs: No Transportation Needs (02/28/2024)   PRAPARE - Administrator, Civil Service (Medical): No    Lack of Transportation (Non-Medical): No  Physical Activity: Inactive (02/28/2024)   Exercise Vital Sign    Days of Exercise per Week: 0 days    Minutes of Exercise per Session: 0 min  Stress: No Stress Concern Present (02/28/2024)    [employer] of Occupational Health - Occupational Stress Questionnaire    Feeling of Stress: Only a little  Social Connections: Unknown (02/28/2024)   Social Connection and Isolation Panel    Frequency of Communication with Friends and Family: Once a week    Frequency of Social Gatherings with Friends and Family: Never    Attends Religious Services: Never    Database administrator or Organizations: No    Attends Engineer, structural: Never    Marital Status: Patient declined   Family History  Problem Relation Age of Onset   Arthritis Paternal Grandfather    Stomach cancer Maternal Grandfather        Died at 71   Other Father        Numbness of Unknown Etiology   Mental illness Father    Arthritis Father    Mental illness Mother        Bipolar   Lupus Brother    Asthma Brother  Multiple sclerosis Paternal Aunt    Outpatient Encounter Medications as of 06/11/2024  Medication Sig   norelgestromin -ethinyl estradiol  (ZAFEMY) 150-35 MCG/24HR transdermal patch Place 1 patch onto the skin once a week.   [DISCONTINUED] Cholecalciferol  1.25 MG (50000 UT) capsule Take 1 capsule (50,000 Units total) by mouth once a week.   [DISCONTINUED] levothyroxine  (SYNTHROID ) 88 MCG tablet Take 1 tablet (88 mcg total) by mouth daily before breakfast.   Cholecalciferol  1.25 MG (50000 UT) capsule Take 1 capsule (50,000 Units total) by mouth once a week.   levothyroxine  (SYNTHROID ) 88 MCG tablet Take 1 tablet (88 mcg total) by mouth daily before breakfast.   No facility-administered encounter medications on file as of 06/11/2024.   ALLERGIES: No Known Allergies  VACCINATION STATUS: Immunization History  Administered Date(s) Administered   DTaP 04/26/2000, 07/03/2000, 10/02/2000, 05/21/2001, 01/19/2005   HIB (PRP-OMP) 04/26/2000, 07/03/2000, 10/02/2000, 05/21/2001   HPV Quadrivalent 05/07/2013, 04/01/2014, 04/30/2015   Hepatitis A 03/05/2006, 02/11/2008   Hepatitis B 2000/03/28,  10/02/2000, 02/25/2001   IPV 04/26/2000, 07/03/2000, 10/02/2000, 01/19/2005   MMR 02/25/2001, 01/19/2005   Meningococcal B Recombinant 05/02/2016, 09/13/2017   Meningococcal Conjugate 04/01/2014, 05/02/2016   Moderna Sars-Covid-2 Vaccination 02/04/2020, 02/18/2020   Pneumococcal Conjugate-13 04/26/2000, 07/03/2000, 10/02/2000   Tdap 04/24/2012   Varicella 05/21/2001, 03/05/2006    Thyroid  Problem Presents for follow-up visit. Symptoms include fatigue. Patient reports no anxiety, cold intolerance, constipation, depressed mood, diarrhea, heat intolerance, menstrual problem, palpitations, tremors, weight gain or weight loss. The symptoms have been stable.    Sandy Miller is 23 y.o. female who is being seen in follow-up for the management of hypothyroidism, vitamin D  deficiency.    She is currently on Levothyroxine  88 mcg p.o. daily.  She reports better compliance with the medication, however she still sometimes forgets to take it.   - She has distant cousins with various forms of thyroid  dysfunction, however no family history of thyroid  malignancy.     -She denies palpitations, tremors, heat intolerance.  She is eating better, stable weight. -She denies dysphagia, shortness of breath, nor voice change.   Review of systems  Constitutional: + Minimally fluctuating body weight,  current Body mass index is 21.4 kg/m. , + fatigue-stable, no subjective hyperthermia, no subjective hypothermia Eyes: no blurry vision, no xerophthalmia ENT: no sore throat, no nodules palpated in throat, no dysphagia/odynophagia, + intermittent hoarseness Cardiovascular: no chest pain, no shortness of breath, no palpitations, no leg swelling Respiratory: no cough, no shortness of breath Gastrointestinal: no nausea/vomiting/diarrhea Musculoskeletal: + diffuse joint aches- has seen rheumatology  Skin: no rashes, no hyperemia, + dry skin Neurological: no tremors, no numbness, no tingling, no  dizziness Psychiatric: no depression, no anxiety   Objective:    BP 108/70 (BP Location: Right Arm, Patient Position: Sitting, Cuff Size: Large)   Pulse 73   Ht 5' 2 (1.575 m)   LMP 06/08/2024 (Exact Date)   BMI 21.40 kg/m    Wt Readings from Last 3 Encounters:  02/28/24 117 lb (53.1 kg)  12/10/23 121 lb 9.6 oz (55.2 kg)  06/11/23 117 lb 3.2 oz (53.2 kg)    BP Readings from Last 3 Encounters:  06/11/24 108/70  02/28/24 132/87  12/10/23 124/74    Physical Exam- Limited  Constitutional:  Body mass index is 21.4 kg/m. , not in acute distress, flat affect Eyes:  EOMI, no exophthalmos Musculoskeletal: no gross deformities, strength intact in all four extremities, no gross restriction of joint movements Skin:  no rashes, no hyperemia Neurological:  no tremor with outstretched hands    CMP     Component Value Date/Time   NA 135 10/10/2013 1239   K 4.1 10/10/2013 1239   CL 99 10/10/2013 1239   CO2 25 10/10/2013 1239   GLUCOSE 78 10/10/2013 1239   BUN 9 10/10/2013 1239   CREATININE 0.87 10/10/2013 1239   CALCIUM  9.8 10/10/2013 1239     Diabetic Labs (most recent): Lab Results  Component Value Date   HGBA1C 5.3 11/07/2018   HGBA1C 5.7 (H) 10/10/2013     Lab Results  Component Value Date   TSH 8.950 (H) 06/03/2024   TSH 3.600 11/28/2023   TSH 5.000 (H) 06/06/2023   TSH 8.900 (H) 11/30/2022   TSH 2.97 05/25/2022   TSH 3.48 02/22/2022   TSH 2.03 10/21/2021   TSH 3.71 08/18/2021   TSH 4.950 (H) 05/26/2021   TSH 9.460 (H) 01/27/2021   FREET4 1.22 06/03/2024   FREET4 1.31 11/28/2023   FREET4 1.32 06/06/2023   FREET4 1.28 11/30/2022   FREET4 1.1 05/25/2022   FREET4 1.1 02/22/2022   FREET4 1.4 10/21/2021   FREET4 1.1 08/18/2021   FREET4 1.30 05/26/2021   FREET4 1.31 01/27/2021      Latest Reference Range & Units 06/06/23 09:23 11/28/23 09:02 06/03/24 10:48  TSH 0.450 - 4.500 uIU/mL 5.000 (H) 3.600 8.950 (H)  T4,Free(Direct) 0.82 - 1.77 ng/dL 8.67 8.68  8.77  (H): Data is abnormally high  Assessment & Plan:   1.  Hypothyroidism:  -Her previsit thyroid  function tests are consistent appropriate hormone replacement (TSH high but Free T4- coinciding with her missing many doses in the last several months).  She is advised to continue Levothyroxine  88 mcg po daily before breakfast.  Will recheck TFTs prior to next visit and adjust dose accordingly.   - We discussed about the correct intake of her thyroid  hormone, on empty stomach at fasting, with water, separated by at least 30 minutes from breakfast and other medications,  and separated by more than 4 hours from calcium , iron, multivitamins, acid reflux medications (PPIs). -Patient is made aware of the fact that thyroid  hormone replacement is needed for life, dose to be adjusted by periodic monitoring of thyroid  function tests.  She is informed to notify clinic if she confirms or plans pregnancy, that she will need a higher dose during the first trimester.    2.  Vitamin D  deficiency: Her recent vitamin D  level from 06/03/24 was 42.  She is currently on Ergocalciferol  50000 units weekly.  She is advised to continue this.  - I advised her to maintain close follow up with Margarete Maeola DASEN, FNP for primary care needs.    I spent  22  minutes in the care of the patient today including review of labs from Thyroid  Function, CMP, and other relevant labs ; imaging/biopsy records (current and previous including abstractions from other facilities); face-to-face time discussing  her lab results and symptoms, medications doses, her options of short and long term treatment based on the latest standards of care / guidelines;   and documenting the encounter.  Sandy Miller  participated in the discussions, expressed understanding, and voiced agreement with the above plans.  All questions were answered to her satisfaction. she is encouraged to contact clinic should she have any questions or concerns prior to  her return visit.   Follow up plan: Return in about 6 months (around 12/09/2024) for Thyroid  follow up, Previsit labs.  Benton Rio, FNP-BC [facility]  [address] [place], [place] [address] Phone: [phone] Fax: [phone]   06/11/2024, 10:10 AM

## 2024-06-11 NOTE — Patient Instructions (Signed)

## 2024-10-20 ENCOUNTER — Other Ambulatory Visit: Payer: Self-pay | Admitting: Adult Health

## 2024-12-09 ENCOUNTER — Ambulatory Visit: Admitting: Nurse Practitioner
